# Patient Record
Sex: Female | Born: 1950
Health system: Southern US, Community
[De-identification: ages and names within clinical notes are randomized; demographics above are authoritative.]

## PROBLEM LIST (undated history)

## (undated) DIAGNOSIS — T4145XA Adverse effect of unspecified anesthetic, initial encounter: Secondary | ICD-10-CM

## (undated) DIAGNOSIS — T8859XA Other complications of anesthesia, initial encounter: Secondary | ICD-10-CM

## (undated) DIAGNOSIS — M5136 Other intervertebral disc degeneration, lumbar region: Secondary | ICD-10-CM

## (undated) DIAGNOSIS — E079 Disorder of thyroid, unspecified: Secondary | ICD-10-CM

## (undated) DIAGNOSIS — R42 Dizziness and giddiness: Secondary | ICD-10-CM

## (undated) DIAGNOSIS — N289 Disorder of kidney and ureter, unspecified: Secondary | ICD-10-CM

## (undated) DIAGNOSIS — I2510 Atherosclerotic heart disease of native coronary artery without angina pectoris: Secondary | ICD-10-CM

## (undated) DIAGNOSIS — E039 Hypothyroidism, unspecified: Secondary | ICD-10-CM

## (undated) DIAGNOSIS — Z9889 Other specified postprocedural states: Secondary | ICD-10-CM

## (undated) DIAGNOSIS — G4730 Sleep apnea, unspecified: Secondary | ICD-10-CM

## (undated) DIAGNOSIS — R112 Nausea with vomiting, unspecified: Secondary | ICD-10-CM

## (undated) DIAGNOSIS — J320 Chronic maxillary sinusitis: Secondary | ICD-10-CM

## (undated) DIAGNOSIS — M810 Age-related osteoporosis without current pathological fracture: Secondary | ICD-10-CM

## (undated) DIAGNOSIS — K862 Cyst of pancreas: Secondary | ICD-10-CM

## (undated) DIAGNOSIS — I700 Atherosclerosis of aorta: Secondary | ICD-10-CM

## (undated) HISTORY — PX: CARPAL TUNNEL RELEASE: SHX101

## (undated) HISTORY — DX: Sleep apnea, unspecified: G47.30

## (undated) HISTORY — PX: BACK SURGERY: SHX140

---

## 1970-01-29 HISTORY — PX: CYSTECTOMY: SUR359

## 1978-01-29 HISTORY — PX: TUBAL LIGATION: SHX77

## 1997-11-15 ENCOUNTER — Ambulatory Visit (HOSPITAL_COMMUNITY): Admission: RE | Admit: 1997-11-15 | Discharge: 1997-11-15 | Payer: Self-pay | Admitting: Neurosurgery

## 1998-01-24 ENCOUNTER — Encounter: Payer: Self-pay | Admitting: Neurosurgery

## 1998-01-24 ENCOUNTER — Inpatient Hospital Stay (HOSPITAL_COMMUNITY): Admission: RE | Admit: 1998-01-24 | Discharge: 1998-01-25 | Payer: Self-pay | Admitting: Neurosurgery

## 1998-11-11 ENCOUNTER — Encounter: Payer: Self-pay | Admitting: Neurosurgery

## 1998-11-11 ENCOUNTER — Ambulatory Visit (HOSPITAL_COMMUNITY): Admission: RE | Admit: 1998-11-11 | Discharge: 1998-11-11 | Payer: Self-pay | Admitting: Neurosurgery

## 1998-11-25 ENCOUNTER — Ambulatory Visit (HOSPITAL_COMMUNITY): Admission: RE | Admit: 1998-11-25 | Discharge: 1998-11-25 | Payer: Self-pay | Admitting: Neurosurgery

## 1998-11-25 ENCOUNTER — Encounter: Payer: Self-pay | Admitting: Neurosurgery

## 1998-12-01 ENCOUNTER — Encounter: Admission: RE | Admit: 1998-12-01 | Discharge: 1998-12-01 | Payer: Self-pay | Admitting: Neurosurgery

## 1998-12-01 ENCOUNTER — Encounter: Payer: Self-pay | Admitting: Neurosurgery

## 1999-01-04 ENCOUNTER — Encounter: Payer: Self-pay | Admitting: Neurosurgery

## 1999-01-06 ENCOUNTER — Encounter: Payer: Self-pay | Admitting: Neurosurgery

## 1999-01-06 ENCOUNTER — Ambulatory Visit (HOSPITAL_COMMUNITY): Admission: RE | Admit: 1999-01-06 | Discharge: 1999-01-07 | Payer: Self-pay | Admitting: Neurosurgery

## 1999-12-28 ENCOUNTER — Other Ambulatory Visit: Admission: RE | Admit: 1999-12-28 | Discharge: 1999-12-28 | Payer: Self-pay | Admitting: Obstetrics and Gynecology

## 2000-01-05 ENCOUNTER — Encounter: Payer: Self-pay | Admitting: Obstetrics and Gynecology

## 2000-01-05 ENCOUNTER — Encounter: Admission: RE | Admit: 2000-01-05 | Discharge: 2000-01-05 | Payer: Self-pay | Admitting: Obstetrics and Gynecology

## 2000-01-12 ENCOUNTER — Other Ambulatory Visit: Admission: RE | Admit: 2000-01-12 | Discharge: 2000-01-12 | Payer: Self-pay | Admitting: Obstetrics and Gynecology

## 2000-01-12 ENCOUNTER — Encounter (INDEPENDENT_AMBULATORY_CARE_PROVIDER_SITE_OTHER): Payer: Self-pay | Admitting: Specialist

## 2000-06-20 ENCOUNTER — Ambulatory Visit (HOSPITAL_COMMUNITY): Admission: RE | Admit: 2000-06-20 | Discharge: 2000-06-20 | Payer: Self-pay | Admitting: Internal Medicine

## 2000-06-20 ENCOUNTER — Encounter: Payer: Self-pay | Admitting: Internal Medicine

## 2000-12-31 ENCOUNTER — Other Ambulatory Visit: Admission: RE | Admit: 2000-12-31 | Discharge: 2000-12-31 | Payer: Self-pay | Admitting: Obstetrics and Gynecology

## 2001-01-06 ENCOUNTER — Encounter: Payer: Self-pay | Admitting: Obstetrics and Gynecology

## 2001-01-06 ENCOUNTER — Encounter: Admission: RE | Admit: 2001-01-06 | Discharge: 2001-01-06 | Payer: Self-pay | Admitting: Obstetrics and Gynecology

## 2001-01-10 ENCOUNTER — Encounter: Admission: RE | Admit: 2001-01-10 | Discharge: 2001-01-10 | Payer: Self-pay | Admitting: Gastroenterology

## 2001-01-10 ENCOUNTER — Encounter: Payer: Self-pay | Admitting: Gastroenterology

## 2001-01-13 ENCOUNTER — Encounter: Admission: RE | Admit: 2001-01-13 | Discharge: 2001-01-13 | Payer: Self-pay | Admitting: Obstetrics and Gynecology

## 2001-01-13 ENCOUNTER — Encounter: Payer: Self-pay | Admitting: Obstetrics and Gynecology

## 2001-04-23 ENCOUNTER — Ambulatory Visit (HOSPITAL_COMMUNITY): Admission: RE | Admit: 2001-04-23 | Discharge: 2001-04-23 | Payer: Self-pay | Admitting: Gastroenterology

## 2002-01-06 ENCOUNTER — Other Ambulatory Visit: Admission: RE | Admit: 2002-01-06 | Discharge: 2002-01-06 | Payer: Self-pay | Admitting: Obstetrics and Gynecology

## 2002-02-13 ENCOUNTER — Encounter: Admission: RE | Admit: 2002-02-13 | Discharge: 2002-02-13 | Payer: Self-pay | Admitting: Obstetrics and Gynecology

## 2002-02-13 ENCOUNTER — Encounter: Payer: Self-pay | Admitting: Obstetrics and Gynecology

## 2002-04-23 ENCOUNTER — Encounter: Payer: Self-pay | Admitting: Family Medicine

## 2002-04-23 ENCOUNTER — Ambulatory Visit (HOSPITAL_COMMUNITY): Admission: RE | Admit: 2002-04-23 | Discharge: 2002-04-23 | Payer: Self-pay | Admitting: Family Medicine

## 2002-10-23 ENCOUNTER — Ambulatory Visit (HOSPITAL_COMMUNITY): Admission: RE | Admit: 2002-10-23 | Discharge: 2002-10-23 | Payer: Self-pay | Admitting: Internal Medicine

## 2002-10-23 ENCOUNTER — Encounter: Payer: Self-pay | Admitting: Internal Medicine

## 2002-11-27 ENCOUNTER — Ambulatory Visit (HOSPITAL_COMMUNITY): Admission: RE | Admit: 2002-11-27 | Discharge: 2002-11-27 | Payer: Self-pay | Admitting: Internal Medicine

## 2002-12-31 ENCOUNTER — Ambulatory Visit (HOSPITAL_COMMUNITY): Admission: RE | Admit: 2002-12-31 | Discharge: 2002-12-31 | Payer: Self-pay | Admitting: Internal Medicine

## 2003-01-08 ENCOUNTER — Other Ambulatory Visit: Admission: RE | Admit: 2003-01-08 | Discharge: 2003-01-08 | Payer: Self-pay | Admitting: Obstetrics and Gynecology

## 2003-02-18 ENCOUNTER — Encounter: Admission: RE | Admit: 2003-02-18 | Discharge: 2003-02-18 | Payer: Self-pay | Admitting: Obstetrics and Gynecology

## 2003-03-09 ENCOUNTER — Encounter: Admission: RE | Admit: 2003-03-09 | Discharge: 2003-03-09 | Payer: Self-pay | Admitting: Obstetrics and Gynecology

## 2003-05-05 ENCOUNTER — Ambulatory Visit (HOSPITAL_COMMUNITY): Admission: RE | Admit: 2003-05-05 | Discharge: 2003-05-05 | Payer: Self-pay | Admitting: Internal Medicine

## 2003-05-14 ENCOUNTER — Ambulatory Visit (HOSPITAL_COMMUNITY): Admission: RE | Admit: 2003-05-14 | Discharge: 2003-05-14 | Payer: Self-pay | Admitting: Family Medicine

## 2004-03-21 ENCOUNTER — Encounter: Admission: RE | Admit: 2004-03-21 | Discharge: 2004-03-21 | Payer: Self-pay | Admitting: Obstetrics and Gynecology

## 2004-06-12 ENCOUNTER — Emergency Department (HOSPITAL_COMMUNITY): Admission: EM | Admit: 2004-06-12 | Discharge: 2004-06-12 | Payer: Self-pay | Admitting: Emergency Medicine

## 2004-10-17 ENCOUNTER — Ambulatory Visit: Payer: Self-pay | Admitting: Internal Medicine

## 2004-11-01 ENCOUNTER — Ambulatory Visit: Payer: Self-pay | Admitting: Internal Medicine

## 2004-11-01 ENCOUNTER — Ambulatory Visit (HOSPITAL_COMMUNITY): Admission: RE | Admit: 2004-11-01 | Discharge: 2004-11-01 | Payer: Self-pay | Admitting: Internal Medicine

## 2004-11-10 ENCOUNTER — Other Ambulatory Visit: Admission: RE | Admit: 2004-11-10 | Discharge: 2004-11-10 | Payer: Self-pay | Admitting: Obstetrics and Gynecology

## 2005-02-01 ENCOUNTER — Ambulatory Visit: Payer: Self-pay | Admitting: Internal Medicine

## 2005-02-05 ENCOUNTER — Ambulatory Visit: Payer: Self-pay | Admitting: Internal Medicine

## 2005-02-05 ENCOUNTER — Ambulatory Visit (HOSPITAL_COMMUNITY): Admission: RE | Admit: 2005-02-05 | Discharge: 2005-02-05 | Payer: Self-pay | Admitting: Internal Medicine

## 2005-03-22 ENCOUNTER — Encounter: Admission: RE | Admit: 2005-03-22 | Discharge: 2005-03-22 | Payer: Self-pay | Admitting: Obstetrics and Gynecology

## 2005-05-22 ENCOUNTER — Ambulatory Visit (HOSPITAL_COMMUNITY): Admission: RE | Admit: 2005-05-22 | Discharge: 2005-05-22 | Payer: Self-pay | Admitting: Internal Medicine

## 2005-11-14 ENCOUNTER — Other Ambulatory Visit: Admission: RE | Admit: 2005-11-14 | Discharge: 2005-11-14 | Payer: Self-pay | Admitting: Obstetrics and Gynecology

## 2006-04-01 ENCOUNTER — Encounter: Admission: RE | Admit: 2006-04-01 | Discharge: 2006-04-01 | Payer: Self-pay | Admitting: Obstetrics and Gynecology

## 2007-01-07 ENCOUNTER — Ambulatory Visit (HOSPITAL_COMMUNITY): Admission: RE | Admit: 2007-01-07 | Discharge: 2007-01-07 | Payer: Self-pay | Admitting: Internal Medicine

## 2007-04-02 ENCOUNTER — Encounter: Admission: RE | Admit: 2007-04-02 | Discharge: 2007-04-02 | Payer: Self-pay | Admitting: Obstetrics and Gynecology

## 2007-10-03 ENCOUNTER — Ambulatory Visit (HOSPITAL_COMMUNITY): Admission: RE | Admit: 2007-10-03 | Discharge: 2007-10-03 | Payer: Self-pay | Admitting: Internal Medicine

## 2008-04-02 ENCOUNTER — Encounter: Admission: RE | Admit: 2008-04-02 | Discharge: 2008-04-02 | Payer: Self-pay | Admitting: Obstetrics and Gynecology

## 2008-07-26 ENCOUNTER — Emergency Department (HOSPITAL_COMMUNITY): Admission: EM | Admit: 2008-07-26 | Discharge: 2008-07-26 | Payer: Self-pay | Admitting: Emergency Medicine

## 2009-04-07 ENCOUNTER — Encounter: Admission: RE | Admit: 2009-04-07 | Discharge: 2009-04-07 | Payer: Self-pay | Admitting: Obstetrics and Gynecology

## 2009-07-22 ENCOUNTER — Emergency Department (HOSPITAL_COMMUNITY): Admission: EM | Admit: 2009-07-22 | Discharge: 2009-07-22 | Payer: Self-pay | Admitting: Emergency Medicine

## 2009-10-26 ENCOUNTER — Ambulatory Visit (HOSPITAL_COMMUNITY): Admission: RE | Admit: 2009-10-26 | Discharge: 2009-10-26 | Payer: Self-pay | Admitting: Ophthalmology

## 2009-11-23 ENCOUNTER — Encounter: Admission: RE | Admit: 2009-11-23 | Discharge: 2009-11-23 | Payer: Self-pay | Admitting: Obstetrics and Gynecology

## 2010-03-27 ENCOUNTER — Other Ambulatory Visit: Payer: Self-pay | Admitting: Obstetrics and Gynecology

## 2010-03-27 DIAGNOSIS — Z1231 Encounter for screening mammogram for malignant neoplasm of breast: Secondary | ICD-10-CM

## 2010-04-05 ENCOUNTER — Ambulatory Visit: Payer: Self-pay

## 2010-04-13 ENCOUNTER — Ambulatory Visit: Payer: Self-pay

## 2010-05-08 LAB — URINALYSIS, ROUTINE W REFLEX MICROSCOPIC
Glucose, UA: NEGATIVE mg/dL
pH: 5.5 (ref 5.0–8.0)

## 2010-05-08 LAB — CBC
HCT: 40.9 % (ref 36.0–46.0)
MCV: 92.9 fL (ref 78.0–100.0)
Platelets: 296 10*3/uL (ref 150–400)
RBC: 4.4 MIL/uL (ref 3.87–5.11)
WBC: 11.3 10*3/uL — ABNORMAL HIGH (ref 4.0–10.5)

## 2010-05-08 LAB — DIFFERENTIAL
Eosinophils Absolute: 0.1 10*3/uL (ref 0.0–0.7)
Eosinophils Relative: 1 % (ref 0–5)
Lymphocytes Relative: 45 % (ref 12–46)
Lymphs Abs: 5.1 10*3/uL — ABNORMAL HIGH (ref 0.7–4.0)
Monocytes Relative: 7 % (ref 3–12)
Neutrophils Relative %: 47 % (ref 43–77)

## 2010-05-08 LAB — URINE MICROSCOPIC-ADD ON

## 2010-05-08 LAB — BASIC METABOLIC PANEL
BUN: 29 mg/dL — ABNORMAL HIGH (ref 6–23)
Chloride: 105 mEq/L (ref 96–112)
GFR calc Af Amer: >60 mL/min (ref >60)
GFR calc non Af Amer: >60 mL/min (ref >60)
Potassium: 3 mEq/L — ABNORMAL LOW (ref 3.5–5.1)

## 2010-06-15 ENCOUNTER — Ambulatory Visit
Admission: RE | Admit: 2010-06-15 | Discharge: 2010-06-15 | Disposition: A | Discharge status: Home / Self Care | Payer: 59 | Source: Ambulatory Visit | Attending: Obstetrics and Gynecology | Admitting: Obstetrics and Gynecology

## 2010-06-15 DIAGNOSIS — Z1231 Encounter for screening mammogram for malignant neoplasm of breast: Secondary | ICD-10-CM

## 2010-06-16 NOTE — H&P (Signed)
NAME:  Kristin [name], Kristin [name]                 ACCOUNT NO.:  [id number]   MEDICAL RECORD NO.:  [id number]          PATIENT TYPE:  AMB   LOCATION:  DAY                           FACILITY:  [facility]   PHYSICIAN:  Lionel December, M.D.    DATE OF BIRTH:  Jul 23, 1950   DATE OF ADMISSION:  DATE OF DISCHARGE:  LH                                HISTORY & PHYSICAL   PRESENTING COMPLAINT:  1.  Dysphagia.  2.  Odynophagia.  3.  Rectal bleeding.  4.  History of colonic polyps.   HISTORY OF PRESENT ILLNESS:  Kristin [name] is a 60 year old Caucasian female patient  of Dr. Sherwood Gambler who is here for scheduled visit.  She was last seen in January  2005.   She initially presented with dysphagia back in October 2004.  She had prior  barium study revealing cervical esophageal prominent cricopharyngeal  impression and frequent tertiary contractions of the esophagus.  She  underwent esophagogastroduodenoscopy with esophageal dilation.  On November 27, 2002, she had normal exam of the esophagus. Esophageal dilation was  accomplished for passing 56-French Maloney dilator causing small tear in the  cervical esophagus suggesting a web.  She also had antral gastritis.  Her  H.pylori serology was negative.  She noted some, but incomplete resolution  of her dysphagia.  She, therefore, had repeat barium study in December 2004,  which revealed mildly impaired esophageal motility, but no evidence of  stricture or ring, and she is able to swallow 13 mm barium pill without any  difficulty.  She says her dysphagia has gotten worse.  It is intermittent  and she also has pain in her lower sternal area that is occurring at a  frequency of once a week.  This occurs but once a week.  She has most  difficulty with solids, but at times has difficulty drinking water.  She  states her heartburn is well controlled with double dose therapy.  She  denies hoarseness, chronic cough or need to clear her throat.  She also  complains of intermittent  hematochezia.  She passed a small to moderate  amount of fresh blood through the bowel movements.  She denies diarrhea,  constipation or lower abdominal pain.  She had colonoscopy by Dr. Charna Elizabeth 6 years ago.  She had one large polyp removed, but two others were  small, and possibly hyperplastic and less benign.  She was advised a follow  up exam in five years, but because of her father's illness, she has not had  a follow up exam, but she would like to get it done locally.  She lost her  father in June this year secondary to complications of chemotherapy given to  him following surgery for colon carcinoma.  He was 33-46 years old.  Kristin [name]  has good appetite.  She denies anorexia or weight loss.   MEDICATIONS:  1.  Aciphex 20 mg b.i.d.  2.  __________ eye drops daily.  3.  Rolaids p.r.n.  4.  Tylenol 1-2 t.i.d. p.r.n.   PAST MEDICAL HISTORY:  1.  History of back pain.  She has had surgery for bulging disk and she had      a fall and injured her back again, leading to second surgery which was      about 6 years ago.  2.  History of colonic polyp and GERD as above.  3.  Rubber banding for external hemorrhoids by Dr. Earlene Plater.   ALLERGIES:  PENICILLIN.   FAMILY HISTORY:  Father was diagnosed with colon carcinoma at age 66 and  died secondary to complications of adjuvant chemotherapy.   SOCIAL HISTORY:  She is married.  She had one daughter who died at age 56  secondary to ruptured intracranial aneurysm.  She used to work at [employer] until it relocated, and for the past 10 years she has been  working with [employer] in [place].  She smoked cigarettes off and on a  few years but quit about 18 years ago.   PHYSICAL EXAMINATION:  GENERAL APPEARANCE:  Pleasant, well-developed, well-  nourished, Caucasian female who is in no acute distress.  VITAL SIGNS:  She weighs 135 pounds, 5 feet 2 inches tall, pulse 68, blood  pressure 108/62, temperature 98.  HEENT:   Conjunctivae is pink.  Sclerae is nonicteric.  Oropharyngeal and  mucosa is normal.  NECK:  No masses or thyromegaly.  CARDIAC:  Regular rhythm.  Normal S1, S3.  No murmur or gallop noted.  LUNGS:  Clear to auscultation.  ABDOMEN:  Symmetrical, soft and tenderness with no organomegaly or masses.  RECTAL:  Deferred.  No clubbing or edema noted.   ASSESSMENT:  1.  Dysphagia/odynophagia.  She has chronic gastroesophageal reflux disease      and her heartburn appears to be well controlled with double dose of PPI      and occasional use of over-the-counter antacids.  Barium pill study in      December 2004 suggested esophageal dysmotility.  I suspect we may be      dealing with diffuse esophageal spasm or nutcracker esophagus.  If she      does not respond to symptomatic therapy, she will need esophageal      monometry.  2.  Hematochezia, possibly secondary to hemorrhoids.  She has history of      polyps and is overdue for several years, colonoscopy, since her last      exam which was 6 years ago, she also lost her father due to      complications of chemotherapy from colon carcinoma.   RECOMMENDATIONS:  1.  She will continue antireflux measures and Aciphex as before.  2.  Trial with NuLev sublingual one before each meal, samples given.  3.  Will schedule for diagnostic/surveillance colonoscopy over the next two      weeks.  If her esophageal symptoms persist, may consider EGD and      possible ED prior to manometry or impedence study.      Lionel December, M.D.  Electronically Signed     NR/MEDQ  D:  10/17/2004  T:  10/18/2004  Job:  [id number]   cc:   Madelin Rear. Sherwood Gambler, MD  Fax: [phone]

## 2010-06-16 NOTE — Op Note (Signed)
NAMEAZUCENA, DART                 ACCOUNT NO.:  [id number]   MEDICAL RECORD NO.:  [id number]          PATIENT TYPE:  AMB   LOCATION:  DAY                           FACILITY:  [facility]   PHYSICIAN:  Lionel December, M.D.    DATE OF BIRTH:  [birth year]   DATE OF PROCEDURE:  02/05/2005  DATE OF DISCHARGE:                                 OPERATIVE REPORT   PROCEDURE:  Esophageal manometry.   INDICATION:  Kristin [name] is a 60 year old Caucasian female who continues to  experience dysphagia intermittently both to solids as well as liquids. She  had her esophagus dilated on two occasions with transient relief of her  symptoms. She is suspected to have motility disorder based on barium study,  but she had no difficulty swallowing 13-mm barium pill. She is undergoing  manometry to further evaluate her dysphagia. Procedures were reviewed with  the patient, and informed consent was obtained.   PROCEDURE:  Standard   FINDINGS:  Esophageal body. Eight out of 10 swallows were peristaltic and  two were simultaneous. Mean amplitude at 13 cm was 52.7 mm, at 8 cm was 77.2  and at 3 cm was 151.3 and mean and distal two ports was 114.2. Mean duration  3.0 seconds at 13 cm, 3.7 seconds at 8 cm, 4.9 seconds at 3 cm and mean and  distal two ports was 4.3.   The lower esophageal sphincter was located between 43 to 45 cm from the  nares.   Mean resting LES pressure was 20.1. A relaxation was 86.7%.   IMPRESSION:  1.  Normal LES resting pressure with adequate relaxation (greater than 75%).  2.  Nonspecific esophageal motility disorder with 80% of swallows being      peristaltic and 20% simultaneous.   RECOMMENDATIONS:  She will continue anti-reflux measures and PPI. The  patient advised to spend few extra minutes when she eats meals. She will  keep a written diary as to the frequency of these episodes. This will be  reviewed with the patient in a month and will determine whether she should  be referred for  impedence study.      Lionel December, M.D.  Electronically Signed     NR/MEDQ  D:  02/06/2005  T:  02/06/2005  Job:  [id number]
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
## 2010-06-16 NOTE — Op Note (Signed)
NAME:  Kristin, [name]                           ACCOUNT NO.:  [id number]   MEDICAL RECORD NO.:  [id number]                   PATIENT TYPE:  AMB   LOCATION:  DAY                                  FACILITY:  [facility]   PHYSICIAN:  Lionel December, M.D.                 DATE OF BIRTH:  12-May-1950   DATE OF PROCEDURE:  11/27/2002  DATE OF DISCHARGE:                                 OPERATIVE REPORT   PROCEDURE:  Esophagogastroduodenoscopy.   INDICATIONS FOR PROCEDURE:  Kristin [name] is a 60 year old Caucasian female who has  been having intermittent dysphasia primarily to solids for the last several  months.  She also has intermittent heartburn.  She also has experienced  occasional odynophagia.  Recent barium study revealed a prominent  cricopharyngeus and tertiary contractions but no stricture or ring.  She is  undergoing diagnostic and therapeutic EGD.  She was given Protonix samples  yesterday which she is to start today.   The procedure risks were reviewed with the patient, and informed consent for  the procedure was obtained.   PREOPERATIVE MEDICATIONS:  Cetacaine spray for pharyngeal topical  anesthesia, Demerol 25 mg IV, Versed 6 mg IV in divided doses.   FINDINGS:  The procedure was performed in the endoscopy suite.  The  patient's vital signs and O2 saturations were monitored during the procedure  and remained stable.  The patient was placed in the left lateral recumbent  position, and the Olympus videoscope was passed via the oropharynx without  any difficulty into the esophagus.   Esophagus:  The mucosa of the esophagus was normal throughout.  The  squamocolumnar junction was unremarkable.  No ring or stricture was noted.   Stomach:  It was empty and distended very well with insufflation.  The folds  of the proximal stomach were normal.  Examination of  the mucosa revealed  multiple petechiae of the gastric body and nodular antral mucosa with  erythema.  The pyloric channel was  patent.  The angularis and fundus were  examined by retroflexing the scope and were normal.  The cardia was also  normal.   Duodenum:  Examination of the bulb and second part of the duodenum was  normal.  The endoscope was withdrawn.   The esophagus was dilated by passing a 56 [place] Maloney dilator through the  esophagus completely.  As the dilator was withdrawn, the endoscope was  passed again.  There was a very small tear in the cervical esophagus,  possibly suggesting a web.  No mucosal injury was noted to the GE junction  or the rest of the esophagus.  The endoscope was withdrawn.  The patient  tolerated the procedure well.   FINAL DIAGNOSES:  1. Antral gastritis.  2. Normal examination of the esophagus.  However, dilation resulted in a     small tear in the cervical esophagus, suggesting an esophageal web.  RECOMMENDATIONS:  1. She will continue antireflux measures and will start Protonix 40 mg p.o.     q.a.m.  2. H pylori serology will be checked today.  3. She will return for followup within four weeks.  If she remains with     dysphagia, we will consider esophageal manometry.      ___________________________________________                                            Lionel December, M.D.   NR/MEDQ  D:  11/27/2002  T:  11/27/2002  Job:  [id number]   cc:   Madelin Rear. Sherwood Gambler, M.D.  [address]  [place]  [place] [address]  Fax: [phone]

## 2010-06-16 NOTE — Op Note (Signed)
Kristin [name], Kristin [name]                 ACCOUNT NO.:  [id number]   MEDICAL RECORD NO.:  [id number]          PATIENT TYPE:  AMB   LOCATION:  DAY                           FACILITY:  [facility]   PHYSICIAN:  Lionel December, M.D.    DATE OF BIRTH:  08/14/1950   DATE OF PROCEDURE:  11/01/2004  DATE OF DISCHARGE:                                 OPERATIVE REPORT   PROCEDURE:  Esophagogastroduodenoscopy with esophageal dilation followed by  colonoscopy.   INDICATION:  Lametria is a 60 year old Caucasian female with chronic GERD who  presents with dysphagia and odynophagia. Her last EGD was 2 years ago when  she was found to have a small esophageal valve disrupted by passing 56-  [place] Maloney dilator. She also gives history of intermittent hematochezia  and she had two polyps removed 6 years ago in [place] and is overdue for  surveillance colonoscopy. She would like to get both the studies done at the  same time.   Procedure was reviewed the patient and informed consent was obtained.   PREMEDICATION:  Cetacaine spray pharyngeal topical anesthesia, Demerol 50 mg  IV, Versed 8 mg IV.   FINDINGS:  Procedure performed in endoscopy suite. The patient's vital signs  and O2 sat were monitored during procedure and remained stable.   PROCEDURE:  1.  Esophagogastroduodenoscopy. The patient was placed in left lateral      recumbent position. Olympus videoscope was passed through oropharynx      without any difficulty into esophagus.   Mucosa of the esophagus was normal throughout. No ring stricture was  apparent. GE junction was at 36 cm from the incisors and was unremarkable.   Stomach. It was empty and distended very well with insufflation. Folds of  proximal stomach were normal. Examination of mucosa was normal. There was a  7-8 mm submucosal lesion at antrum suspicious for a small lipoma. This was  left alone. Pyloric channel was patent. Angularis, fundus and cardia were  examined by retroflexion of  the scope and were normal.   Duodenum. Bulbar mucosa was normal. Scope was passed to second part of  duodenal mucosa and folds were normal. Endoscope was withdrawn.   Esophagus was dilated by passing 56-French Maloney dilator to full  insertion. As the dilator was withdrawn, endoscope was passed again and  there was a small bowl-shaped tear noted to cervical esophagus possibly  indicative of web. Pictures taken for the record. Endoscope was withdrawn  and the patient prepared for procedure #2.   Colonoscopy. Rectal examination performed. No abnormality noted in external  or digital exam. Olympus videoscope was placed in the rectum and advanced  under vision into sigmoid colon and beyond. Preparation was satisfactory.  Scope was passed to the cecum which was identified by appendiceal orifice  and ileocecal valve. Pictures taken for the record. As the scope was  withdrawn, colonic mucosa was examined for the second time and there were no  polyps or other abnormalities. Rectal mucosa was normal. Scope was  retroflexed to examine anorectal junction. There was single anal papilla and  small hemorrhoids below  the dentate line. Endoscope was straightened and  withdrawn. The patient tolerated the procedure well.   FINAL DIAGNOSIS:  1.  Small submucosal antral lesion consistent with submucosal lipoma. Normal      examination of the esophagus. However, esophageal dilation resulted in      small tear of cervical esophagus indicative of web.  2.  Normal colonoscopy except single anal papilla and small external      hemorrhoids. Hemorrhoids felt to be source of intermittent hematochezia.      No evidence of recurrent polyps.   RECOMMENDATIONS:  She will continue anti-reflux measures and PPI as before.  She will call office with progress report in 1 week. If she remains with  dysphagia, will bring her back for esophageal manometry.   She will undergo surveillance colonoscopy in 5 years from  now.      Lionel December, M.D.  Electronically Signed     NR/MEDQ  D:  11/01/2004  T:  11/01/2004  Job:  [id number]   cc:   Madelin Rear. Sherwood Gambler, MD  Fax: [phone]

## 2010-06-16 NOTE — Op Note (Signed)
[place]  Patient:    Kristin [name], FOULK Visit Number: [id number] MRN: [id number]          Service Type: END Location: ENDO Attending Physician:  Charna Elizabeth Dictated by:   Anselmo Rod, M.D. Proc. Date: 04/23/01 Admit Date:  04/23/2001   CC:         Silverio Lay, M.D.   Operative Report  DATE OF BIRTH:  1951/01/14  REFERRING PHYSICIAN:  Silverio Lay, M.D.  PROCEDURE PERFORMED:  Colonoscopy.  ENDOSCOPIST:  Anselmo Rod, M.D.  INSTRUMENT USED:  Olympus video colonoscope.  INDICATIONS FOR PROCEDURE:  The patient is a 60 year old white female with a history of rectal bleeding.  Rule out colonic polyps, masses, hemorrhoids, etc.  PREPROCEDURE PREPARATION:  Informed consent was procured from the patient. The patient was fasted for eight hours prior to the procedure and prepped with a bottle of magnesium citrate and a gallon of NuLytely the night prior to the procedure.  PREPROCEDURE PHYSICAL:  The patient had stable vital signs.  Neck supple. Chest clear to auscultation.  S1, S2 regular, no murmurs, gallops or rubs, rales, rhonchi or wheezing.  Abdomen soft with normal bowel sounds.  DESCRIPTION OF PROCEDURE:  The patient was placed in the left lateral decubitus position and sedated with 70 mg of Demerol and 7 mg of Versed intravenously.  Once the patient was adequately sedated and maintained on low-flow oxygen and continuous cardiac monitoring, the Olympus video colonoscope was advanced from the rectum to the cecum without difficulty. Except for a prominent internal hemorrhoid, no other abnormalities were seen up to the cecum.  The appendicular orifice and the ileocecal valve were clearly visualized and photographed.  There were no other masses, polyps, erosions, ulcerations or diverticula present.  IMPRESSION:  Prominent internal hemorrhoid.  Otherwise normal-appearing colon up to the cecum.  RECOMMENDATIONS: 1.  Surgical evaluation for possible hemorrhoidectomy. 2. Outpatient follow-up in the next three to four weeks. 3. Repeat colorectal screening in the next five years unless the patient were    to develop any abnormal symptoms in the interim.Dictated by:   Anselmo Rod, M.D. Attending Physician:  Charna Elizabeth DD:  04/23/01 TD:  04/24/01 Job: [id number]

## 2010-11-07 ENCOUNTER — Other Ambulatory Visit (HOSPITAL_COMMUNITY): Payer: Self-pay | Admitting: Internal Medicine

## 2010-11-07 DIAGNOSIS — M47812 Spondylosis without myelopathy or radiculopathy, cervical region: Secondary | ICD-10-CM

## 2010-11-08 ENCOUNTER — Ambulatory Visit (HOSPITAL_COMMUNITY)
Admission: RE | Admit: 2010-11-08 | Discharge: 2010-11-08 | Disposition: A | Discharge status: Home / Self Care | Payer: 59 | Source: Ambulatory Visit | Attending: Internal Medicine | Admitting: Internal Medicine

## 2010-11-08 DIAGNOSIS — M47812 Spondylosis without myelopathy or radiculopathy, cervical region: Secondary | ICD-10-CM

## 2010-11-08 DIAGNOSIS — M5020 Other cervical disc displacement, unspecified cervical region: Secondary | ICD-10-CM | POA: Insufficient documentation

## 2010-11-08 DIAGNOSIS — M542 Cervicalgia: Secondary | ICD-10-CM | POA: Insufficient documentation

## 2010-11-08 DIAGNOSIS — M25519 Pain in unspecified shoulder: Secondary | ICD-10-CM | POA: Insufficient documentation

## 2010-11-09 ENCOUNTER — Inpatient Hospital Stay (HOSPITAL_COMMUNITY): Admission: RE | Admit: 2010-11-09 | Payer: 59 | Source: Ambulatory Visit

## 2011-06-06 ENCOUNTER — Other Ambulatory Visit: Payer: Self-pay | Admitting: Obstetrics and Gynecology

## 2011-06-06 DIAGNOSIS — Z1231 Encounter for screening mammogram for malignant neoplasm of breast: Secondary | ICD-10-CM

## 2011-06-19 ENCOUNTER — Ambulatory Visit: Payer: 59

## 2011-06-19 ENCOUNTER — Ambulatory Visit
Admission: RE | Admit: 2011-06-19 | Discharge: 2011-06-19 | Disposition: A | Discharge status: Home / Self Care | Payer: 59 | Source: Ambulatory Visit | Attending: Obstetrics and Gynecology | Admitting: Obstetrics and Gynecology

## 2011-06-19 DIAGNOSIS — Z1231 Encounter for screening mammogram for malignant neoplasm of breast: Secondary | ICD-10-CM

## 2011-11-16 ENCOUNTER — Other Ambulatory Visit: Payer: Self-pay | Admitting: Internal Medicine

## 2011-11-16 ENCOUNTER — Other Ambulatory Visit (HOSPITAL_COMMUNITY): Payer: Self-pay | Admitting: Family Medicine

## 2011-11-16 ENCOUNTER — Ambulatory Visit (HOSPITAL_COMMUNITY)
Admission: RE | Admit: 2011-11-16 | Discharge: 2011-11-16 | Disposition: A | Discharge status: Home / Self Care | Payer: 59 | Source: Ambulatory Visit | Attending: Family Medicine | Admitting: Family Medicine

## 2011-11-16 DIAGNOSIS — R079 Chest pain, unspecified: Secondary | ICD-10-CM

## 2011-11-16 DIAGNOSIS — R918 Other nonspecific abnormal finding of lung field: Secondary | ICD-10-CM | POA: Insufficient documentation

## 2011-11-19 ENCOUNTER — Ambulatory Visit (HOSPITAL_COMMUNITY)
Admission: RE | Admit: 2011-11-19 | Discharge: 2011-11-19 | Disposition: A | Discharge status: Home / Self Care | Payer: 59 | Source: Ambulatory Visit | Attending: Family Medicine | Admitting: Family Medicine

## 2011-11-19 DIAGNOSIS — R911 Solitary pulmonary nodule: Secondary | ICD-10-CM | POA: Insufficient documentation

## 2011-11-19 DIAGNOSIS — R079 Chest pain, unspecified: Secondary | ICD-10-CM

## 2011-11-19 MED ORDER — IOHEXOL 300 MG/ML  SOLN
80.0000 mL | Freq: Once | INTRAMUSCULAR | Status: AC | PRN
  Administered 2011-11-19: 80 mL via INTRAVENOUS

## 2011-12-06 ENCOUNTER — Ambulatory Visit (INDEPENDENT_AMBULATORY_CARE_PROVIDER_SITE_OTHER): Payer: 59 | Admitting: Emergency Medicine

## 2011-12-06 ENCOUNTER — Encounter: Payer: Self-pay | Admitting: Emergency Medicine

## 2011-12-06 VITALS — BP 124/70 | HR 66 | Temp 98.2°F | Ht 61.5 in | Wt 129.2 lb

## 2011-12-06 DIAGNOSIS — R911 Solitary pulmonary nodule: Secondary | ICD-10-CM | POA: Insufficient documentation

## 2011-12-06 NOTE — Patient Instructions (Addendum)
We will repeat your CT scan of the chest in April 2014 Please follow with Dr Delton Coombes in April after the CT scan to review the results If you develop any new symptoms such as cough, shortness of breath, chest pain then please contact our office to be evaluated sooner.

## 2011-12-06 NOTE — Assessment & Plan Note (Signed)
RUL ground glass nodule. No solid component (yet). This is suspicious for BAC. If it gets larger or if it changes consistency on CT scan then I believe she needs PET scan and then TCTS referral to have it removed.

## 2011-12-06 NOTE — Progress Notes (Signed)
Subjective:     Patient ID: Kristin [name], female   DOB: 28-Oct-1950, 61 y.o.   MRN: [id number]  HPI 61 yo remote very mild smoker, hx OSA (not on CPAP), prior neck surgeries. She experienced some R sided CP on 10/18 that sounds atypical. Prompted a CXR that showed a RUL nodule. A CT scan on 10/22 confirmed a RUL peripheral GG nodule without a predominant solid component. No overt pulmonary symptoms. The chest pain went away and hasn't returned. No cough, no fevers, no malaise, no wt loss.   Review of Systems  Constitutional: Negative for fever and unexpected weight change.  HENT: Negative for ear pain, nosebleeds, congestion, sore throat, rhinorrhea, sneezing, trouble swallowing, dental problem, postnasal drip and sinus pressure.   Eyes: Negative for redness and itching.  Respiratory: Positive for cough. Negative for chest tightness, shortness of breath and wheezing.   Cardiovascular: Negative for palpitations and leg swelling.  Gastrointestinal: Positive for diarrhea. Negative for nausea and vomiting.  Genitourinary: Negative for dysuria.  Musculoskeletal: Negative for joint swelling.  Skin: Negative for rash.  Neurological: Negative for headaches.  Hematological: Does not bruise/bleed easily.  Psychiatric/Behavioral: Negative for dysphoric mood. The patient is not nervous/anxious.    Past Medical History  Diagnosis Date  . Sleep apnea      Family History  Problem Relation Age of Onset  . Cancer Father     colon  . Stroke Paternal Grandfather   . Cancer Maternal Grandmother      History   Social History  . Marital Status: Married    Spouse Name: N/A    Number of Children: 1  . Years of Education: N/A   Occupational History  .  [employer]   Social History Main Topics  . Smoking status: Former Smoker -- 3 years    Types: Cigarettes    Quit date: 01/30/1971  . Smokeless tobacco: Never Used  . Alcohol Use: No  . Drug Use: No  . Sexually Active: Not on file    Other Topics Concern  . Not on file   Social History Narrative  . No narrative on file     Allergies  Allergen Reactions  . Penicillins      No outpatient prescriptions prior to visit.    Last reviewed on 12/06/2011  3:07 PM by Lazarus Salines, RN       Objective:   Physical Exam Filed Vitals:   12/06/11 1507  BP: 124/70  Pulse: 66  Temp: 98.2 F (36.8 C)   Gen: Pleasant, well-nourished, in no distress,  normal affect  ENT: No lesions,  mouth clear,  oropharynx clear, no postnasal drip  Neck: No JVD, no TMG, no carotid bruits  Lungs: No use of accessory muscles, no dullness to percussion, clear without rales or rhonchi  Cardiovascular: RRR, heart sounds normal, no murmur or gallops, no peripheral edema  Musculoskeletal: No deformities, no cyanosis or clubbing  Neuro: alert, non focal  Skin: Warm, no lesions or rashes     Assessment:      Pulmonary nodule RUL ground glass nodule. No solid component (yet). This is suspicious for BAC. If it gets larger or if it changes consistency on CT scan then I believe she needs PET scan and then TCTS referral to have it removed.

## 2011-12-07 ENCOUNTER — Telehealth: Payer: Self-pay | Admitting: Emergency Medicine

## 2011-12-07 DIAGNOSIS — R911 Solitary pulmonary nodule: Secondary | ICD-10-CM

## 2011-12-07 NOTE — Telephone Encounter (Signed)
Pulmonary nodule - BYRUM,ROBERT S., MD 12/06/2011 3:42 PM Signed  RUL ground glass nodule. No solid component (yet). This is suspicious for BAC. If it gets larger or if it changes consistency on CT scan then I believe she needs PET scan and then TCTS referral to have it removed.   Patient Instructions     We will repeat your CT scan of the chest in April 2014  Please follow with Dr Delton Coombes in April after the CT scan to review the results  If you develop any new symptoms such as cough, shortness of breath, chest pain then please contact our office to be evaluated sooner.     RB, please advise. Thanks.

## 2011-12-11 NOTE — Telephone Encounter (Signed)
Pt is calling back to let RB know she wants tyo go ahead and be seen by a Careers adviser. Pt aware we will forward msg to RB and call once this has been taken care or scheduled.

## 2011-12-11 NOTE — Telephone Encounter (Signed)
lmomtcb x1 

## 2011-12-11 NOTE — Telephone Encounter (Signed)
Patient is calling back about getting appt. With Careers adviser.

## 2011-12-12 NOTE — Telephone Encounter (Signed)
Will make the referral and order the PET scan now

## 2011-12-13 NOTE — Telephone Encounter (Signed)
Spoke with pt. She states that she is aware that referral was made- nothing further needed.

## 2011-12-20 ENCOUNTER — Encounter (HOSPITAL_COMMUNITY)
Admission: RE | Admit: 2011-12-20 | Discharge: 2011-12-20 | Disposition: A | Discharge status: Home / Self Care | Payer: 59 | Source: Ambulatory Visit | Attending: Emergency Medicine | Admitting: Emergency Medicine

## 2011-12-20 ENCOUNTER — Ambulatory Visit: Payer: Self-pay | Admitting: Obstetrics and Gynecology

## 2011-12-20 ENCOUNTER — Institutional Professional Consult (permissible substitution) (INDEPENDENT_AMBULATORY_CARE_PROVIDER_SITE_OTHER): Payer: 59 | Admitting: Thoracic Surgery (Cardiothoracic Vascular Surgery)

## 2011-12-20 ENCOUNTER — Encounter: Payer: Self-pay | Admitting: Thoracic Surgery (Cardiothoracic Vascular Surgery)

## 2011-12-20 VITALS — BP 134/80 | HR 92 | Resp 18 | Ht 61.5 in | Wt 125.0 lb

## 2011-12-20 DIAGNOSIS — R911 Solitary pulmonary nodule: Secondary | ICD-10-CM

## 2011-12-20 MED ORDER — FLUDEOXYGLUCOSE F - 18 (FDG) INJECTION
19.4000 | Freq: Once | INTRAVENOUS | Status: AC | PRN
  Administered 2011-12-20: 19.4 via INTRAVENOUS

## 2011-12-20 NOTE — Progress Notes (Signed)
Patient ID: Kristin [name], female   DOB: [birth year], 61 y.o.   MRN: [id number]  Mrs. Fencl is a 61 year old woman who was sent to [facility] for evaluation of a groundglass opacity in the right upper lobe.  She was found to have a groundglass opacity on CT scan in October.  She had a PET CT done earlier this morning which showed that the groundglass opacity had resolved. She had been treated with antibiotics in the meantime, so this likely was a localized infection.  Second finding on the PET CT was a cystic area in the pancreas. This was not hypermetabolic and was felt to possibly be a pseudocyst or cystic adenoma. A followup MRI in 6 months with the recommendation.   Diagnostic Studies CT Chest 11/19/11 CT CHEST WITH CONTRAST  Technique: Multidetector CT imaging of the chest was performed  following the standard protocol during bolus administration of  intravenous contrast.  Contrast: 80mL OMNIPAQUE IOHEXOL 300 MG/ML SOLN  Comparison: Chest radiographs dated 11/16/2011. CT chest dated  05/14/2003.  Findings: Radiographic abnormality corresponds to a 12 x 14 mm  subpleural ground-glass nodule/opacity in the lateral right upper  lobe (series 3/image 16). Mild dependent atelectasis in the  bilateral lower lobes. No pleural effusion or pneumothorax.  Visualized thyroid is unremarkable.  The heart is normal in size. No pericardial effusion.  No suspicious mediastinal, hilar, or axillary lymphadenopathy.  The visualized upper abdomen is unremarkable.  Visualized osseous structures are within normal limits.  IMPRESSION:  12 x 14 mm subpleural ground-glass nodule/opacity in the lateral  right upper lobe, corresponding to the sonographic abnormality.  While infection/inflammation is possible, the appearance is  worrisome for low grade adenocarcinoma. Initial follow-up CT chest  is suggested in 3 months to document persistence.  This recommendation follows the consensus statement:    Recommendations for the Management of Subsolid Pulmonary Nodules  Detected at CT: A Statement from the Fleischner Society.  Radiology 2013; 266:304-317.   PET/CT 12/20/11  NUCLEAR MEDICINE PET SKULL BASE TO THIGH  Fasting Blood Glucose: 113  Technique: 19.4 mCi F-18 FDG was injected intravenously. CT data  was obtained and used for attenuation correction and anatomic  localization only. (This was not acquired as a diagnostic CT  examination.) Additional exam technical data entered on  technologist worksheet.  Comparison: 11/19/2011  Findings:  Neck: No hypermetabolic lymph nodes in the neck.  Chest: No hypermetabolic mediastinal or hilar nodes. No  suspicious pulmonary nodules on the CT scan.  Abdomen/Pelvis: No abnormal hypermetabolic activity within the  liver, pancreas, adrenal glands, or spleen. No hypermetabolic  lymph nodes in the abdomen or pelvis. Fluid attenuation structure  within the head of pancreas measures 1.9 cm, image 132. No  significant FDG uptake is associated this structure. On the CT  from 04/05/2009 this measured 1.4 cm. Hyperdense cyst is noted  within the inferior pole the right kidney measuring approximately 7  mm, image 127.  Skeleton: No focal hypermetabolic activity to suggest skeletal  metastasis.  IMPRESSION:  1. Resolution of ground-glass nodule within the right upper lobe.  2. Fluid attenuation structure within the head of pancreas  demonstrates mild increased in size from 04/05/2009. Differential  for this appearance includes pseudocyst, branch duct IMPN, or  serous cystadenoma. Recommend follow-up MR or CT and 6 months.   Impression: Resolved groundglass opacity in right upper lobe, no further follow up needed for that issue.

## 2011-12-21 NOTE — Progress Notes (Signed)
Quick Note:  ATC patient, patient not home. Left message for patient to call office back. WCB ______

## 2011-12-24 ENCOUNTER — Telehealth: Payer: Self-pay | Admitting: Pulmonary Disease

## 2011-12-24 NOTE — Telephone Encounter (Signed)
Notes Recorded by Leslye Peer, MD on 12/20/2011 at 5:19 PM Please let the patient know that the nodular lesion has resolved on her PET scan. This is good news. She doesn't need to see the surgeons.   lmomtcb x1

## 2011-12-24 NOTE — Telephone Encounter (Signed)
I spoke with patient about results and she verbalized understanding and had no questions 

## 2012-01-11 ENCOUNTER — Encounter: Payer: Self-pay | Admitting: Obstetrics and Gynecology

## 2012-01-11 ENCOUNTER — Ambulatory Visit (INDEPENDENT_AMBULATORY_CARE_PROVIDER_SITE_OTHER): Payer: 59 | Admitting: Obstetrics and Gynecology

## 2012-01-11 VITALS — BP 122/68 | Ht 61.75 in | Wt 120.5 lb

## 2012-01-11 DIAGNOSIS — M810 Age-related osteoporosis without current pathological fracture: Secondary | ICD-10-CM

## 2012-01-11 DIAGNOSIS — Z124 Encounter for screening for malignant neoplasm of cervix: Secondary | ICD-10-CM

## 2012-01-11 DIAGNOSIS — Z01419 Encounter for gynecological examination (general) (routine) without abnormal findings: Secondary | ICD-10-CM

## 2012-01-11 MED ORDER — RISEDRONATE SODIUM 150 MG PO TABS: 150.0000 mg | ORAL_TABLET | ORAL | Status: DC

## 2012-01-11 NOTE — Progress Notes (Signed)
Subjective:    The patient has never been taking hormone replacement therapy The patient  is taking a Calcium supplement. And Actonel Post-menopausal bleeding:no  Last Pap: was normal October  2012 Last mammogram: was normal May  2013 Last DEXA scan : T= -2.04 November 2009 Pt will sched at later date Last colonoscopy:normal  2011  Urinary symptoms: none Normal bowel movements: Yes Reports abuse at home: No:   Subjective:    Kristin [name] is a 61 y.o. female No obstetric history on file. who presents for annual exam.  The patient has no complaints today.   The following portions of the patient's history were reviewed and updated as appropriate: allergies, current medications, past family history, past medical history, past social history, past surgical history and problem list.  Review of Systems Pertinent items are noted in HPI. Gastrointestinal:No change in bowel habits, no abdominal pain, no rectal bleeding Genitourinary:negative for dysuria, frequency, hematuria, nocturia and urinary incontinence    Objective:     There were no vitals taken for this visit.  Weight:  Wt Readings from Last 1 Encounters:  12/20/11 125 lb (56.7 kg)     BMI: There is no height or weight on file to calculate BMI. General Appearance: Alert, appropriate appearance for age. No acute distress HEENT: Grossly normal Neck / Thyroid: Supple, no masses, nodes or enlargement Lungs: clear to auscultation bilaterally Back: No CVA tenderness Breast Exam: No masses or nodes.No dimpling, nipple retraction or discharge. Cardiovascular: Regular rate and rhythm. S1, S2, no murmur Gastrointestinal: Soft, non-tender, no masses or organomegaly Pelvic Exam: Vulva and vagina appear normal. Bimanual exam reveals normal uterus and adnexa. Rectovaginal: normal rectal, no masses Lymphatic Exam: Non-palpable nodes in neck, clavicular, axillary, or inguinal regions Skin: no rash or abnormalities Neurologic: Normal  gait and speech, no tremor  Psychiatric: Alert and oriented, appropriate affect.     Assessment:    Normal gyn exam    Plan:   mammogram pap smear return annually or prn DEXA scan to schedule    Silverio Lay MD

## 2012-01-17 ENCOUNTER — Ambulatory Visit: Payer: 59

## 2012-01-17 ENCOUNTER — Ambulatory Visit (INDEPENDENT_AMBULATORY_CARE_PROVIDER_SITE_OTHER): Payer: 59

## 2012-01-17 DIAGNOSIS — M810 Age-related osteoporosis without current pathological fracture: Secondary | ICD-10-CM

## 2012-01-17 LAB — PAP IG W/ RFLX HPV ASCU

## 2012-01-18 ENCOUNTER — Telehealth: Payer: Self-pay

## 2012-01-18 LAB — VITAMIN D 25 HYDROXY (VIT D DEFICIENCY, FRACTURES): Vit D, 25-Hydroxy: 27 ng/mL — ABNORMAL LOW (ref 30–89)

## 2012-01-18 MED ORDER — ERGOCALCIFEROL 1.25 MG (50000 UT) PO CAPS: ORAL_CAPSULE | ORAL | Status: DC

## 2012-01-18 NOTE — Progress Notes (Signed)
Quick Note:  Please inform patient that Vitamin D is low. Initiate Vitamin D protocol. ______ 

## 2012-01-18 NOTE — Telephone Encounter (Signed)
Message copied by Darien Ramus on Fri Jan 18, 2012 12:51 PM ------      Message from: Larwance Rote      Created: Fri Jan 18, 2012 11:14 AM                   ----- Message -----         From: Esmeralda Arthur, MD         Sent: 01/18/2012   8:06 AM           To: Butler Denmark, CMA            Please inform patient that Vitamin D is low. Initiate Vitamin D protocol.

## 2012-01-18 NOTE — Telephone Encounter (Signed)
Vitamin D protocol. LVM   Darien Ramus, CMA

## 2012-04-08 ENCOUNTER — Ambulatory Visit (HOSPITAL_COMMUNITY): Payer: 59

## 2012-04-15 ENCOUNTER — Ambulatory Visit (HOSPITAL_COMMUNITY): Payer: 59

## 2012-04-17 ENCOUNTER — Ambulatory Visit (HOSPITAL_COMMUNITY)
Admission: RE | Admit: 2012-04-17 | Discharge: 2012-04-17 | Disposition: A | Discharge status: Home / Self Care | Payer: 59 | Source: Ambulatory Visit | Attending: Emergency Medicine | Admitting: Emergency Medicine

## 2012-04-17 DIAGNOSIS — R911 Solitary pulmonary nodule: Secondary | ICD-10-CM

## 2012-04-25 NOTE — Progress Notes (Signed)
Quick Note:  ATC patient, no answer [id number] ______ 

## 2012-04-28 ENCOUNTER — Telehealth: Payer: Self-pay | Admitting: Emergency Medicine

## 2012-04-28 NOTE — Telephone Encounter (Signed)
Pt aware of test results. Nothing further needed.  

## 2012-05-02 NOTE — Progress Notes (Signed)
Quick Note:  Spoke with patient, informed her of results/recs per RB. Patient verbalized understanding and nothing further needed at this time. ______

## 2012-05-27 ENCOUNTER — Other Ambulatory Visit: Payer: Self-pay

## 2012-05-27 DIAGNOSIS — Z1231 Encounter for screening mammogram for malignant neoplasm of breast: Secondary | ICD-10-CM

## 2012-06-20 ENCOUNTER — Ambulatory Visit
Admission: RE | Admit: 2012-06-20 | Discharge: 2012-06-20 | Disposition: A | Discharge status: Home / Self Care | Payer: 59 | Source: Ambulatory Visit

## 2012-06-20 ENCOUNTER — Ambulatory Visit: Payer: 59

## 2012-06-20 DIAGNOSIS — Z1231 Encounter for screening mammogram for malignant neoplasm of breast: Secondary | ICD-10-CM

## 2012-08-27 ENCOUNTER — Other Ambulatory Visit (HOSPITAL_COMMUNITY): Payer: Self-pay | Admitting: Neurosurgery

## 2012-08-27 DIAGNOSIS — M542 Cervicalgia: Secondary | ICD-10-CM

## 2012-09-08 ENCOUNTER — Ambulatory Visit (HOSPITAL_COMMUNITY): Payer: 59

## 2012-09-08 ENCOUNTER — Other Ambulatory Visit (HOSPITAL_COMMUNITY): Payer: 59

## 2012-09-19 ENCOUNTER — Ambulatory Visit (HOSPITAL_COMMUNITY)
Admission: RE | Admit: 2012-09-19 | Discharge: 2012-09-19 | Disposition: A | Discharge status: Home / Self Care | Payer: 59 | Source: Ambulatory Visit | Attending: Neurosurgery | Admitting: Neurosurgery

## 2012-09-19 DIAGNOSIS — M4802 Spinal stenosis, cervical region: Secondary | ICD-10-CM | POA: Insufficient documentation

## 2012-09-19 DIAGNOSIS — M4712 Other spondylosis with myelopathy, cervical region: Secondary | ICD-10-CM | POA: Insufficient documentation

## 2012-09-19 DIAGNOSIS — M542 Cervicalgia: Secondary | ICD-10-CM

## 2012-09-19 DIAGNOSIS — M47812 Spondylosis without myelopathy or radiculopathy, cervical region: Secondary | ICD-10-CM | POA: Insufficient documentation

## 2012-09-19 DIAGNOSIS — M25519 Pain in unspecified shoulder: Secondary | ICD-10-CM | POA: Insufficient documentation

## 2012-09-19 DIAGNOSIS — Q762 Congenital spondylolisthesis: Secondary | ICD-10-CM | POA: Insufficient documentation

## 2012-09-19 DIAGNOSIS — M5020 Other cervical disc displacement, unspecified cervical region: Secondary | ICD-10-CM | POA: Insufficient documentation

## 2012-09-19 DIAGNOSIS — R209 Unspecified disturbances of skin sensation: Secondary | ICD-10-CM | POA: Insufficient documentation

## 2012-09-19 MED ORDER — DIAZEPAM 5 MG PO TABS
ORAL_TABLET | ORAL | Status: AC
  Filled 2012-09-19: qty 2

## 2012-09-19 MED ORDER — OXYCODONE-ACETAMINOPHEN 5-325 MG PO TABS: 1.0000 | ORAL_TABLET | ORAL | Status: DC | PRN

## 2012-09-19 MED ORDER — IOHEXOL 300 MG/ML  SOLN: 10.0000 mL | Freq: Once | INTRAMUSCULAR | Status: DC | PRN

## 2012-09-19 MED ORDER — DIAZEPAM 5 MG PO TABS
10.0000 mg | ORAL_TABLET | Freq: Once | ORAL | Status: AC
  Administered 2012-09-19: 10 mg via ORAL

## 2012-09-19 MED ORDER — IOHEXOL 300 MG/ML  SOLN
10.0000 mL | Freq: Once | INTRAMUSCULAR | Status: AC | PRN
  Administered 2012-09-19: 10 mL via INTRATHECAL

## 2012-09-19 MED ORDER — ONDANSETRON HCL 4 MG/2ML IJ SOLN: 4.0000 mg | Freq: Four times a day (QID) | INTRAMUSCULAR | Status: DC | PRN

## 2012-09-19 NOTE — Op Note (Signed)
09/19/2012 cervical Myelogram  PATIENT:  Kristin [name] is a 62 y.o. female with neck and upper extremity pain.   PRE-OPERATIVE DIAGNOSIS:  cervicalgia  POST-OPERATIVE DIAGNOSIS:  cervicalgia  PROCEDURE:  Cervical Myelogram  SURGEON:  Azarya Oconnell  ANESTHESIA:   local LOCAL MEDICATIONS USED:  LIDOCAINE  and Amount: 6 ml Procedure Note: Mrs. Bacorn was positioned on the fluoroscopy table prone. Her back was prepped and draped in a sterile manner. I infiltrated 6cc lidocaine into the lumbar region at the level of L4/5. I introduced a spinal needle into the thecal sac at the L3/4 level, then infused 10cc 300 omnipaque into the spinal fluid. She tolerated the procedure without difficulty.    PATIENT DISPOSITION:  PACU - hemodynamically stable.

## 2013-08-21 ENCOUNTER — Other Ambulatory Visit: Payer: Self-pay

## 2013-08-21 DIAGNOSIS — Z1231 Encounter for screening mammogram for malignant neoplasm of breast: Secondary | ICD-10-CM

## 2013-08-26 ENCOUNTER — Encounter (INDEPENDENT_AMBULATORY_CARE_PROVIDER_SITE_OTHER): Payer: Self-pay

## 2013-08-26 ENCOUNTER — Ambulatory Visit
Admission: RE | Admit: 2013-08-26 | Discharge: 2013-08-26 | Disposition: A | Discharge status: Home / Self Care | Payer: 59 | Source: Ambulatory Visit

## 2013-08-26 DIAGNOSIS — Z1231 Encounter for screening mammogram for malignant neoplasm of breast: Secondary | ICD-10-CM

## 2013-11-29 ENCOUNTER — Telehealth: Payer: Self-pay | Admitting: Internal Medicine

## 2013-11-29 NOTE — Telephone Encounter (Signed)
Since 0300 having sharp and stabbing left infrascapular pain, initially severe. Mild after ibuprofen. No associated GI sxs at all. Movement or palpation do not cause or affect.  She wondered if it was "gas pain"  I told her does not seem so and if does not remit, worseins again evaluation today at urgent care or ED appropriate.  She does not seem to have pleuritic pain though might intensify with very deep breath.

## 2013-11-30 ENCOUNTER — Encounter: Payer: Self-pay | Admitting: Obstetrics and Gynecology

## 2014-03-29 ENCOUNTER — Other Ambulatory Visit: Payer: Self-pay | Admitting: Obstetrics and Gynecology

## 2014-03-29 DIAGNOSIS — Z1231 Encounter for screening mammogram for malignant neoplasm of breast: Secondary | ICD-10-CM

## 2014-03-29 DIAGNOSIS — M810 Age-related osteoporosis without current pathological fracture: Secondary | ICD-10-CM

## 2014-04-19 ENCOUNTER — Encounter (HOSPITAL_COMMUNITY): Payer: Self-pay | Admitting: Emergency Medicine

## 2014-04-19 ENCOUNTER — Emergency Department (INDEPENDENT_AMBULATORY_CARE_PROVIDER_SITE_OTHER)
Admission: EM | Admit: 2014-04-19 | Discharge: 2014-04-19 | Disposition: A | Discharge status: Home / Self Care | Payer: 59 | Source: Home / Self Care | Attending: Emergency Medicine | Admitting: Emergency Medicine

## 2014-04-19 DIAGNOSIS — L0100 Impetigo, unspecified: Secondary | ICD-10-CM | POA: Diagnosis not present

## 2014-04-19 DIAGNOSIS — L209 Atopic dermatitis, unspecified: Secondary | ICD-10-CM | POA: Diagnosis not present

## 2014-04-19 HISTORY — DX: Disorder of thyroid, unspecified: E07.9

## 2014-04-19 MED ORDER — NYSTATIN-TRIAMCINOLONE 100000-0.1 UNIT/GM-% EX CREA
TOPICAL_CREAM | CUTANEOUS | Status: DC
Start: 2014-04-19 — End: 2014-08-04

## 2014-04-19 MED ORDER — MINOCYCLINE HCL 100 MG PO CAPS: 100.0000 mg | ORAL_CAPSULE | Freq: Two times a day (BID) | ORAL | Status: DC

## 2014-04-19 NOTE — Discharge Instructions (Signed)
Eczema Eczema, also called atopic dermatitis, is a skin disorder that causes inflammation of the skin. It causes a red rash and dry, scaly skin. The skin becomes very itchy. Eczema is generally worse during the cooler winter months and often improves with the warmth of summer. Eczema usually starts showing signs in infancy. Some children outgrow eczema, but it may last through adulthood.  CAUSES  The exact cause of eczema is not known, but it appears to run in families. People with eczema often have a family history of eczema, allergies, asthma, or hay fever. Eczema is not contagious. Flare-ups of the condition may be caused by:   Contact with something you are sensitive or allergic to.   Stress. SIGNS AND SYMPTOMS  Dry, scaly skin.   Red, itchy rash.   Itchiness. This may occur before the skin rash and may be very intense.  DIAGNOSIS  The diagnosis of eczema is usually made based on symptoms and medical history. TREATMENT  Eczema cannot be cured, but symptoms usually can be controlled with treatment and other strategies. A treatment plan might include:  Controlling the itching and scratching.   Use over-the-counter antihistamines as directed for itching. This is especially useful at night when the itching tends to be worse.   Use over-the-counter steroid creams as directed for itching.   Avoid scratching. Scratching makes the rash and itching worse. It may also result in a skin infection (impetigo) due to a break in the skin caused by scratching.   Keeping the skin well moisturized with creams every day. This will seal in moisture and help prevent dryness. Lotions that contain alcohol and water should be avoided because they can dry the skin.   Limiting exposure to things that you are sensitive or allergic to (allergens).   Recognizing situations that cause stress.   Developing a plan to manage stress.  HOME CARE INSTRUCTIONS   Only take over-the-counter or  prescription medicines as directed by your health care provider.   Do not use anything on the skin without checking with your health care provider.   Keep baths or showers short (5 minutes) in warm (not hot) water. Use mild cleansers for bathing. These should be unscented. You may add nonperfumed bath oil to the bath water. It is best to avoid soap and bubble bath.   Immediately after a bath or shower, when the skin is still damp, apply a moisturizing ointment to the entire body. This ointment should be a petroleum ointment. This will seal in moisture and help prevent dryness. The thicker the ointment, the better. These should be unscented.   Keep fingernails cut short. Children with eczema may need to wear soft gloves or mittens at night after applying an ointment.   Dress in clothes made of cotton or cotton blends. Dress lightly, because heat increases itching.   A child with eczema should stay away from anyone with fever blisters or cold sores. The virus that causes fever blisters (herpes simplex) can cause a serious skin infection in children with eczema. SEEK MEDICAL CARE IF:   Your itching interferes with sleep.   Your rash gets worse or is not better within 1 week after starting treatment.   You see pus or soft yellow scabs in the rash area.   You have a fever.   You have a rash flare-up after contact with someone who has fever blisters.  Document Released: 01/13/2000 Document Revised: 11/05/2012 Document Reviewed: 08/18/2012 ExitCare Patient Information 2015 [employer]. This information   is not intended to replace advice given to you by your health care provider. Make sure you discuss any questions you have with your health care provider.  

## 2014-04-19 NOTE — ED Notes (Signed)
C/o rash on left arm onset Wednesday Denies itching but reports pain Also denies fevers, chills Alert, no signs of acute distress.

## 2014-04-19 NOTE — ED Provider Notes (Signed)
CSN: [id number]     Arrival date & time 04/19/14  8938 History   First MD Initiated Contact with Patient 04/19/14 0845     Chief Complaint  Patient presents with  . 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 Area had only increased in size over last several days, remains slightly pruritic and has developed multiple areas of yellow fluid filled blisters. No fever/chills. No hx of DM. No malaise. PCP; Dr. Hilma Favors  Patient is a 64 y.o. female presenting with rash. The history is provided by the patient.  Rash   Past Medical History  Diagnosis Date  . Sleep apnea   . Thyroid disease    Past Surgical History  Procedure Laterality Date  . Back surgery  1997/1999  . Tubal ligation  1980  . Cystectomy  1972    brreast   Family History  Problem Relation Age of Onset  . Colon cancer Father   . Stroke Paternal Grandfather   . Cirrhosis Maternal Grandmother     liver  . Hypertension Mother    History  Substance Use Topics  . Smoking status: Former Smoker -- 3 years    Types: Cigarettes    Quit date: 01/30/1971  . Smokeless tobacco: Never Used  . Alcohol Use: No   OB History    Gravida Para Term Preterm AB TAB SAB Ectopic Multiple Living   1 1 0 0 0 0 0 0 0 0      Review of Systems  Skin: Positive for rash.  All other systems reviewed and are negative.   Allergies  Penicillins  Home Medications   Prior to Admission medications   Medication Sig Start Date End Date Taking? Authorizing Provider  rosuvastatin (CRESTOR) 5 MG tablet Take 5 mg by mouth every evening.   Yes Historical Provider, MD  aspirin EC 81 MG tablet Take 81 mg by mouth every evening.    Historical Provider, MD  beta carotene w/minerals (OCUVITE) tablet Take 1 tablet by mouth every evening.     Historical Provider, MD  Cholecalciferol (VITAMIN D) 2000 UNITS tablet Take 2,000 Units by mouth every evening.    Historical Provider, MD  cycloSPORINE (RESTASIS) 0.05 % ophthalmic emulsion Place 1 drop into both eyes 2 (two) times daily.    Historical Provider, MD  ibuprofen (ADVIL,MOTRIN) 200 MG tablet Take 400 mg by mouth once.    Historical Provider, MD  minocycline (MINOCIN) 100 MG capsule Take 1 capsule (100 mg total) by mouth 2 (two) times daily. 04/19/14   Lutricia Feil, PA  nystatin-triamcinolone [facility] II) cream Apply to affected area BID 04/19/14   Lutricia Feil, PA  OVER THE COUNTER MEDICATION Take 1 tablet by mouth every evening. Mega Red 500 mg    Historical Provider, MD  OVER THE COUNTER MEDICATION Take 1 tablet by mouth every evening. Generic stool softner    Historical Provider, MD  Oyster Shell (OYSTER CALCIUM) 500 MG TABS tablet Take 500 mg of elemental calcium by mouth every evening.    Historical Provider, MD  risedronate (ACTONEL) 150 MG tablet Take 1 tablet (150 mg total) by mouth every 30 (thirty) days. with water on empty stomach, nothing by mouth or lie down for next 30 minutes. 01/11/12   Delsa Bern, MD   BP 144/86 mmHg  Pulse 95  Temp(Src) 98.2  F (36.8 C) (Oral)  Resp 16  SpO2 95% Physical Exam  Constitutional: She is oriented to person, place, and time. She appears well-developed and well-nourished. No distress.  HENT:  Head: Normocephalic and atraumatic.  Eyes: Conjunctivae are normal.  Cardiovascular: Normal rate.   Pulmonary/Chest: Effort normal.  Musculoskeletal: Normal range of motion.  Neurological: She is alert and oriented to person, place, and time.  Skin: Skin is warm and dry. Rash noted.     Erythematous macular confluent rash with several scattered small yellow fluid filled blisters. Few small scattered erythematous satellite lesions. No induration or fluctuance  Psychiatric: She has a normal mood and affect. Her behavior  is normal.  Nursing note and vitals reviewed.   ED Course  Procedures (including critical care time) Labs Review Labs Reviewed - No data to display  Imaging Review No results found.   MDM   1. Atopic dermatitis   2. Impetigo   Atopic dermatitis that appears to have become secondarily infected. Will treat with topical Mycolog cream and oral minocin and advise PCP follow up if no improvement.     [place], [place] 04/19/14 [phone]

## 2014-05-12 IMAGING — CT CT CHEST W/ CM
2 of 3 series · 15 of 36 positions shown, 18 images · IV contrast (Omnipaque 300)
Comparison: Chest radiographs dated 11/16/2011.  CT chest dated
05/14/2003.

CLINICAL DATA: Chest pain, abnormal chest radiograph

CT CHEST WITH CONTRAST
TECHNIQUE: Multidetector CT imaging of the chest was performed
following the standard protocol during bolus administration of
intravenous contrast.
Contrast: 80mL OMNIPAQUE IOHEXOL 300 MG/ML  SOLN

[Series 2: chestroutine 5.0 b40f · axial · 0.61mm/px · z∈[-275,-35]mm · 12 of 58 slices shown, 15 images]
[im 5/58  mediastinal]
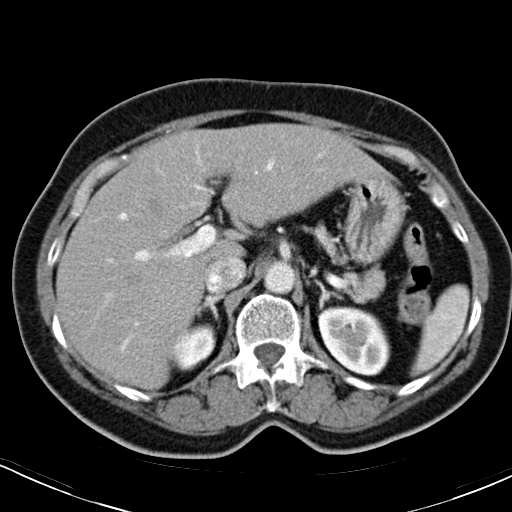
[im 5/58  lung]
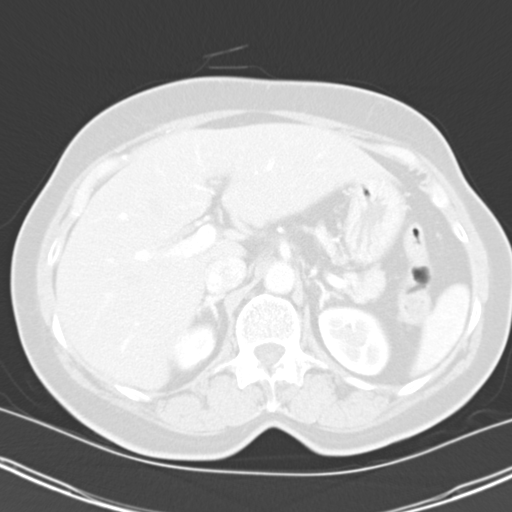
[im 9/58  lung]
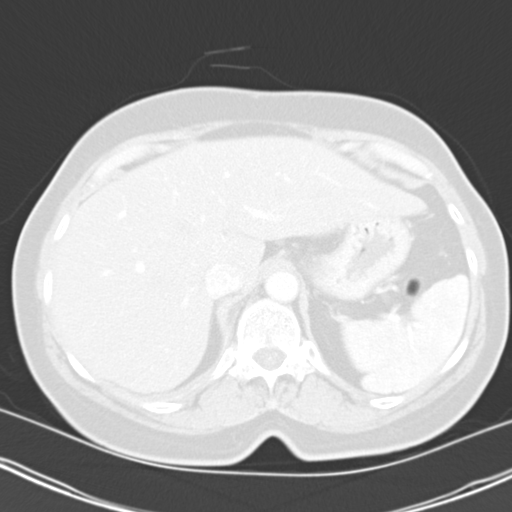
[im 13/58  lung]
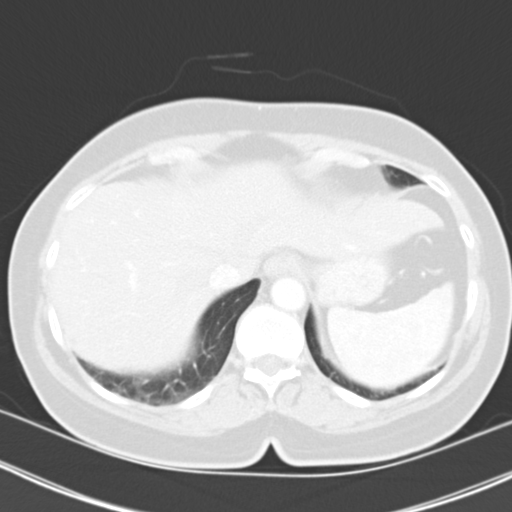
[im 17/58  lung]
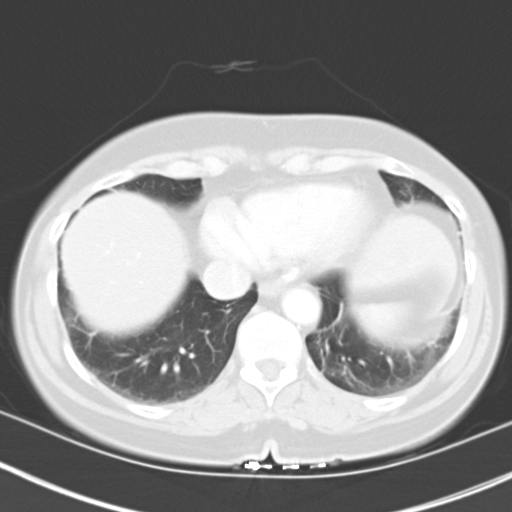
[im 22/58  mediastinal]
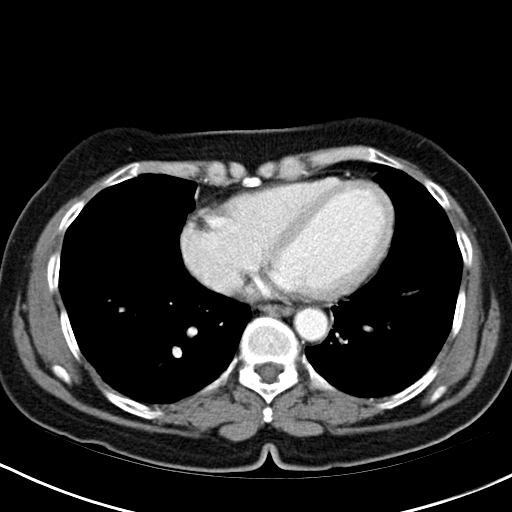
[im 22/58  lung]
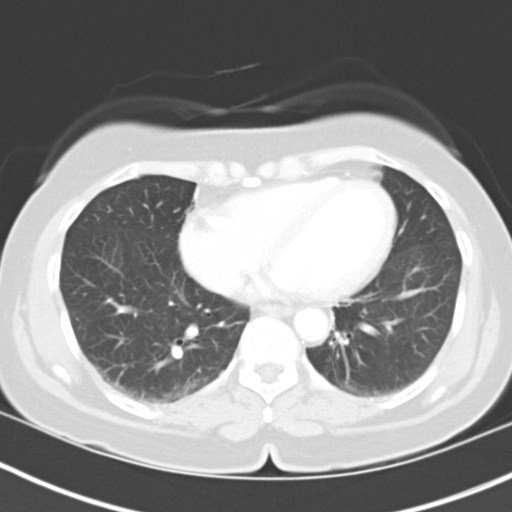
[im 26/58  lung]
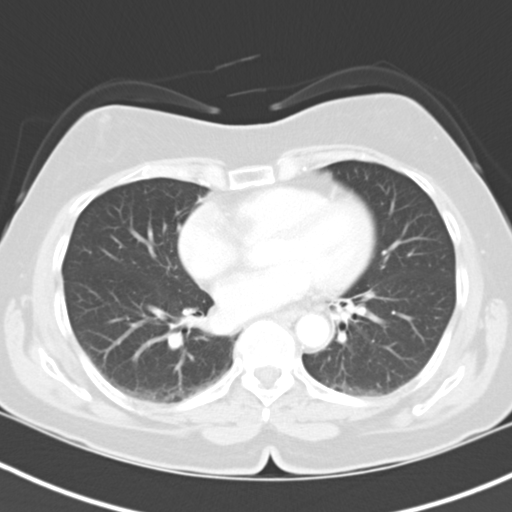
[im 32/58  lung]
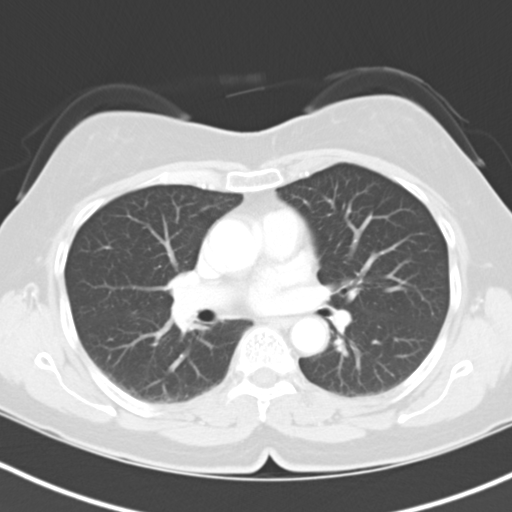
[im 36/58  lung]
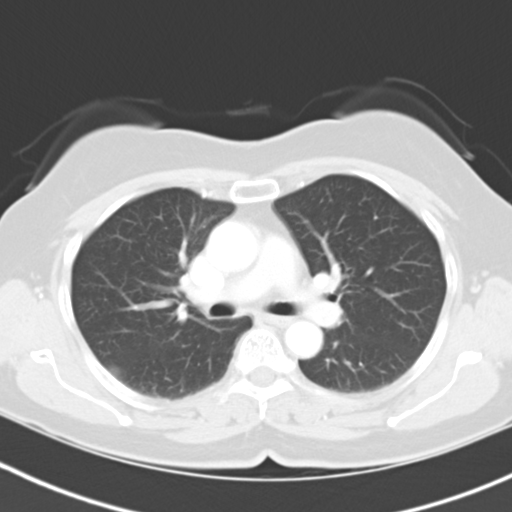
[im 41/58  mediastinal]
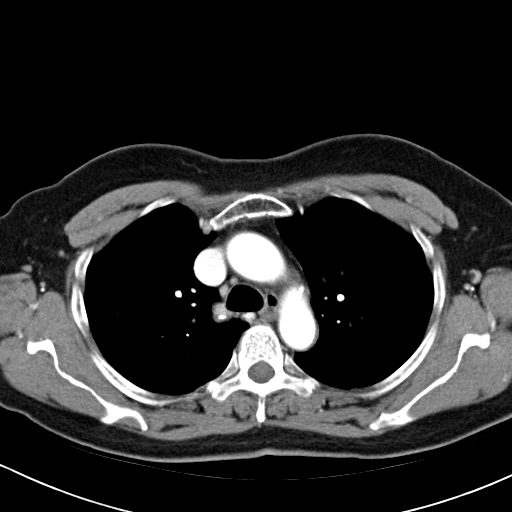
[im 41/58  lung]
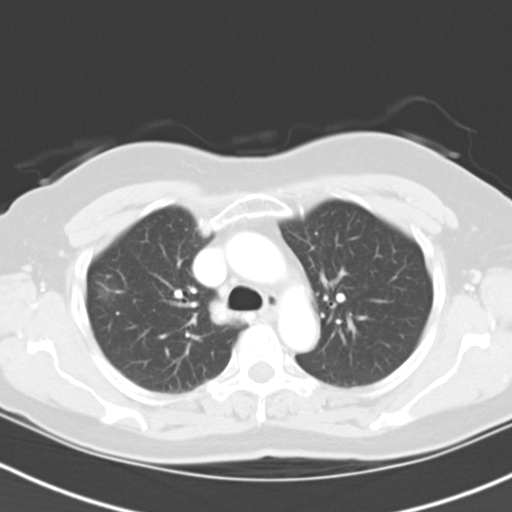
[im 45/58  lung]
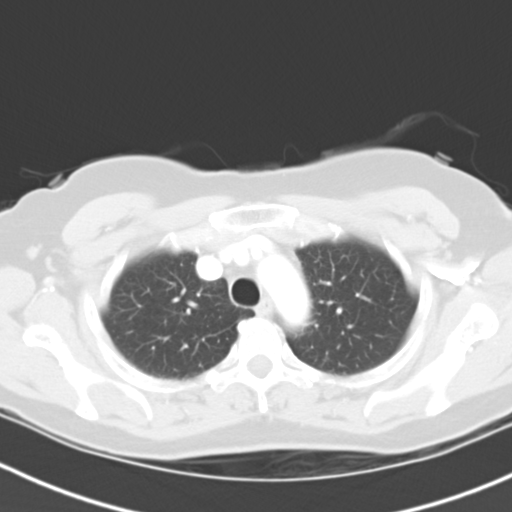
[im 49/58  lung]
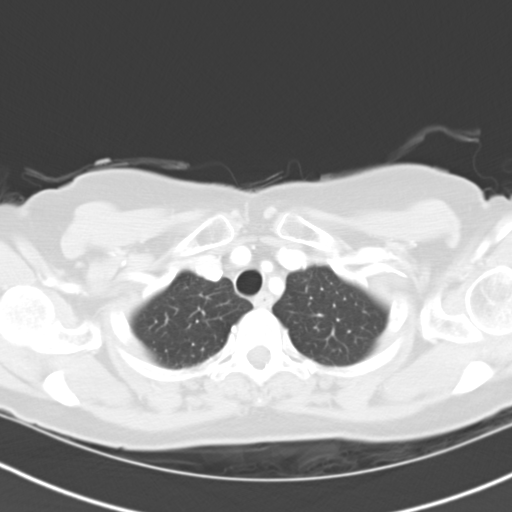
[im 53/58  lung]
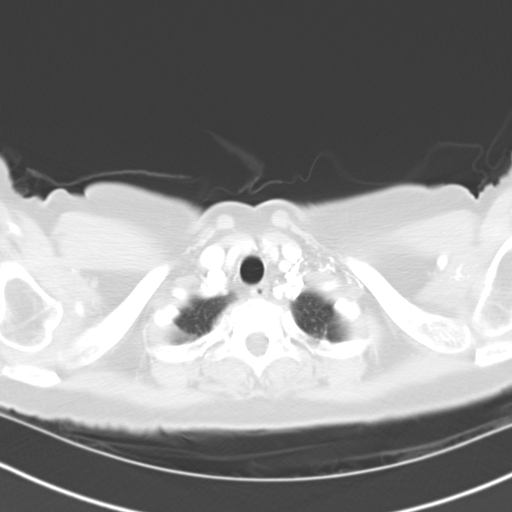

[Series 4: mpr coronal chest 3mm · coronal · 0.57mm/px · 3 of 74 slices shown]
[im 15/74  lung]
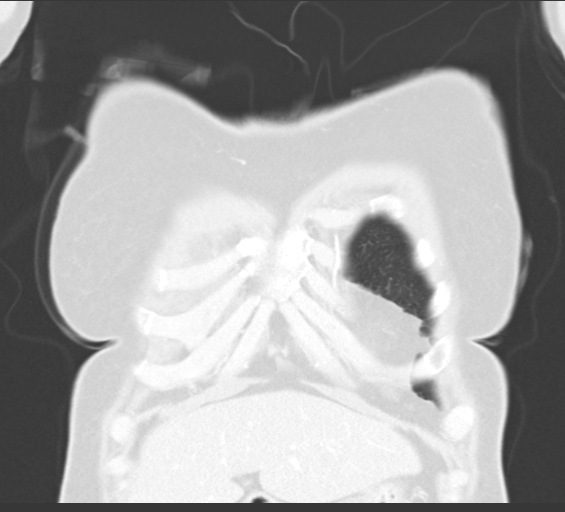
[im 30/74  lung]
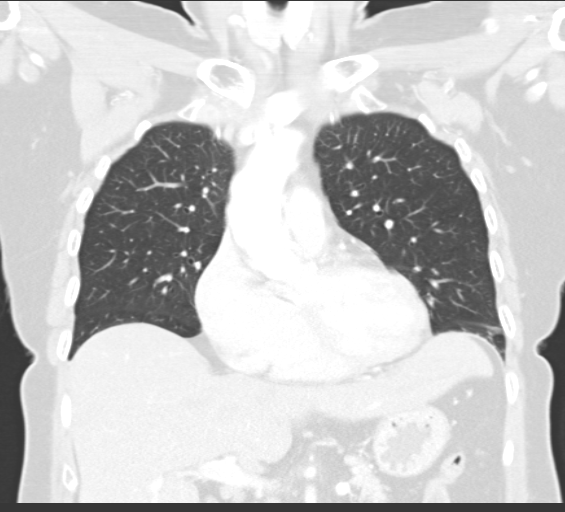
[im 44/74  lung]
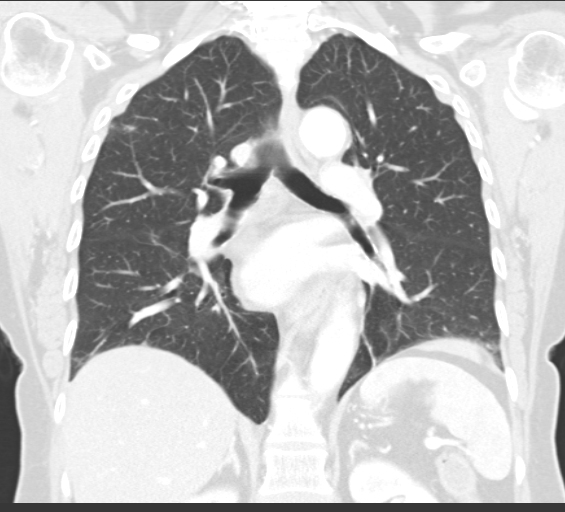

[15 of 36 positions shown; findings below may reference images not displayed]

FINDINGS: Radiographic abnormality corresponds to a 12 x 14 mm
subpleural ground-glass nodule/opacity in the lateral right upper
lobe (series 3/image 16).  Mild dependent atelectasis in the
bilateral lower lobes. No pleural effusion or pneumothorax.

Visualized thyroid is unremarkable.

The heart is normal in size.  No pericardial effusion.

No suspicious mediastinal, hilar, or axillary lymphadenopathy.

The visualized upper abdomen is unremarkable.

Visualized osseous structures are within normal limits.
IMPRESSION: 12 x 14 mm subpleural ground-glass nodule/opacity in the lateral
right upper lobe, corresponding to the sonographic abnormality.
While infection/inflammation is possible, the appearance is
worrisome for low grade adenocarcinoma.  Initial follow-up CT chest
is suggested in 3 months to document persistence.

This recommendation follows the consensus statement:
Recommendations for the Management of Subsolid Pulmonary Nodules
Detected at CT:  A Statement from the [HOSPITAL].

## 2014-06-28 ENCOUNTER — Other Ambulatory Visit (HOSPITAL_COMMUNITY): Payer: Self-pay | Admitting: Family Medicine

## 2014-06-28 DIAGNOSIS — R42 Dizziness and giddiness: Secondary | ICD-10-CM

## 2014-07-08 ENCOUNTER — Ambulatory Visit (HOSPITAL_COMMUNITY)
Admission: RE | Admit: 2014-07-08 | Discharge: 2014-07-08 | Disposition: A | Discharge status: Home / Self Care | Payer: 59 | Source: Ambulatory Visit | Attending: Family Medicine | Admitting: Family Medicine

## 2014-07-08 DIAGNOSIS — R42 Dizziness and giddiness: Secondary | ICD-10-CM | POA: Diagnosis not present

## 2014-07-08 LAB — POCT I-STAT, CHEM 8
BUN: 26 mg/dL — ABNORMAL HIGH (ref 6–20)
Calcium, Ion: 1.23 mmol/L (ref 1.13–1.30)
Chloride: 100 mmol/L — ABNORMAL LOW (ref 101–111)
Creatinine, Ser: 0.9 mg/dL (ref 0.44–1.00)
Glucose, Bld: 184 mg/dL — ABNORMAL HIGH (ref 65–99)
HCT: 45 % (ref 36.0–46.0)
HEMOGLOBIN: 15.3 g/dL — AB (ref 12.0–15.0)
POTASSIUM: 4.2 mmol/L (ref 3.5–5.1)
Sodium: 139 mmol/L (ref 135–145)
TCO2: 25 mmol/L (ref 0–100)

## 2014-07-08 MED ORDER — GADOBENATE DIMEGLUMINE 529 MG/ML IV SOLN
11.0000 mL | Freq: Once | INTRAVENOUS | Status: AC | PRN
  Administered 2014-07-08: 11 mL via INTRAVENOUS

## 2014-07-21 ENCOUNTER — Other Ambulatory Visit: Payer: Self-pay | Admitting: Otolaryngology

## 2014-08-04 ENCOUNTER — Encounter (HOSPITAL_BASED_OUTPATIENT_CLINIC_OR_DEPARTMENT_OTHER): Payer: Self-pay | Admitting: *Deleted

## 2014-08-09 ENCOUNTER — Ambulatory Visit (HOSPITAL_BASED_OUTPATIENT_CLINIC_OR_DEPARTMENT_OTHER)
Admission: RE | Admit: 2014-08-09 | Discharge: 2014-08-09 | Disposition: A | Discharge status: Home / Self Care | Payer: 59 | Source: Ambulatory Visit | Attending: Otolaryngology | Admitting: Otolaryngology

## 2014-08-09 ENCOUNTER — Ambulatory Visit (HOSPITAL_BASED_OUTPATIENT_CLINIC_OR_DEPARTMENT_OTHER): Payer: 59 | Admitting: Anesthesiology

## 2014-08-09 ENCOUNTER — Encounter (HOSPITAL_BASED_OUTPATIENT_CLINIC_OR_DEPARTMENT_OTHER): Payer: Self-pay

## 2014-08-09 ENCOUNTER — Encounter (HOSPITAL_BASED_OUTPATIENT_CLINIC_OR_DEPARTMENT_OTHER)
Admission: RE | Disposition: A | Discharge status: Home / Self Care | Payer: Self-pay | Source: Ambulatory Visit | Attending: Otolaryngology

## 2014-08-09 DIAGNOSIS — G4730 Sleep apnea, unspecified: Secondary | ICD-10-CM | POA: Insufficient documentation

## 2014-08-09 DIAGNOSIS — J320 Chronic maxillary sinusitis: Secondary | ICD-10-CM | POA: Diagnosis present

## 2014-08-09 DIAGNOSIS — B49 Unspecified mycosis: Secondary | ICD-10-CM | POA: Insufficient documentation

## 2014-08-09 DIAGNOSIS — R42 Dizziness and giddiness: Secondary | ICD-10-CM | POA: Diagnosis present

## 2014-08-09 DIAGNOSIS — Z87891 Personal history of nicotine dependence: Secondary | ICD-10-CM | POA: Insufficient documentation

## 2014-08-09 DIAGNOSIS — E039 Hypothyroidism, unspecified: Secondary | ICD-10-CM | POA: Insufficient documentation

## 2014-08-09 HISTORY — DX: Hypothyroidism, unspecified: E03.9

## 2014-08-09 HISTORY — DX: Age-related osteoporosis without current pathological fracture: M81.0

## 2014-08-09 HISTORY — PX: MAXILLARY ANTROSTOMY: SHX2003

## 2014-08-09 HISTORY — PX: NASAL SINUS SURGERY: SHX719

## 2014-08-09 HISTORY — DX: Dizziness and giddiness: R42

## 2014-08-09 LAB — POCT HEMOGLOBIN-HEMACUE: Hemoglobin: 15.2 g/dL — ABNORMAL HIGH (ref 12.0–15.0)

## 2014-08-09 SURGERY — MAXILLARY ANTROSTOMY
Anesthesia: General | Site: Nose | Laterality: Left

## 2014-08-09 MED ORDER — SUCCINYLCHOLINE CHLORIDE 20 MG/ML IJ SOLN
INTRAMUSCULAR | Status: DC | PRN
  Administered 2014-08-09: 100 mg via INTRAVENOUS

## 2014-08-09 MED ORDER — PROMETHAZINE HCL 25 MG/ML IJ SOLN: 6.2500 mg | INTRAMUSCULAR | Status: DC | PRN

## 2014-08-09 MED ORDER — LACTATED RINGERS IV SOLN
INTRAVENOUS | Status: DC
  Administered 2014-08-09 (×2): via INTRAVENOUS

## 2014-08-09 MED ORDER — DEXAMETHASONE SODIUM PHOSPHATE 4 MG/ML IJ SOLN
INTRAMUSCULAR | Status: DC | PRN
  Administered 2014-08-09: 10 mg via INTRAVENOUS

## 2014-08-09 MED ORDER — OXYMETAZOLINE HCL 0.05 % NA SOLN
NASAL | Status: DC | PRN
  Administered 2014-08-09: 1 via TOPICAL

## 2014-08-09 MED ORDER — OXYCODONE-ACETAMINOPHEN 5-325 MG PO TABS: 1.0000 | ORAL_TABLET | ORAL | Status: DC | PRN

## 2014-08-09 MED ORDER — MIDAZOLAM HCL 2 MG/2ML IJ SOLN
INTRAMUSCULAR | Status: AC
  Filled 2014-08-09: qty 2

## 2014-08-09 MED ORDER — FENTANYL CITRATE (PF) 100 MCG/2ML IJ SOLN
INTRAMUSCULAR | Status: AC
  Filled 2014-08-09: qty 6

## 2014-08-09 MED ORDER — GLYCOPYRROLATE 0.2 MG/ML IJ SOLN: 0.2000 mg | Freq: Once | INTRAMUSCULAR | Status: DC | PRN

## 2014-08-09 MED ORDER — HYDROMORPHONE HCL 1 MG/ML IJ SOLN
INTRAMUSCULAR | Status: AC
  Filled 2014-08-09: qty 1

## 2014-08-09 MED ORDER — LIDOCAINE HCL (CARDIAC) 20 MG/ML IV SOLN
INTRAVENOUS | Status: DC | PRN
  Administered 2014-08-09: 50 mg via INTRAVENOUS

## 2014-08-09 MED ORDER — PROPOFOL 10 MG/ML IV BOLUS
INTRAVENOUS | Status: DC | PRN
Start: 2014-08-09 — End: 2014-08-09
  Administered 2014-08-09: 150 mg via INTRAVENOUS

## 2014-08-09 MED ORDER — FLUCONAZOLE 150 MG PO TABS: 150.0000 mg | ORAL_TABLET | Freq: Every day | ORAL | Status: DC

## 2014-08-09 MED ORDER — OXYMETAZOLINE HCL 0.05 % NA SOLN
NASAL | Status: AC
  Filled 2014-08-09: qty 15

## 2014-08-09 MED ORDER — PROPOFOL 10 MG/ML IV BOLUS
INTRAVENOUS | Status: AC
  Filled 2014-08-09: qty 60

## 2014-08-09 MED ORDER — BACITRACIN ZINC 500 UNIT/GM EX OINT
TOPICAL_OINTMENT | CUTANEOUS | Status: DC | PRN
  Administered 2014-08-09: 1 via TOPICAL

## 2014-08-09 MED ORDER — MEPERIDINE HCL 25 MG/ML IJ SOLN: 6.2500 mg | INTRAMUSCULAR | Status: DC | PRN

## 2014-08-09 MED ORDER — CLINDAMYCIN HCL 300 MG PO CAPS: 300.0000 mg | ORAL_CAPSULE | Freq: Three times a day (TID) | ORAL | Status: DC

## 2014-08-09 MED ORDER — FENTANYL CITRATE (PF) 100 MCG/2ML IJ SOLN
50.0000 ug | INTRAMUSCULAR | Status: DC | PRN
  Administered 2014-08-09: 100 ug via INTRAVENOUS

## 2014-08-09 MED ORDER — LIDOCAINE-EPINEPHRINE 1 %-1:100000 IJ SOLN
INTRAMUSCULAR | Status: DC | PRN
  Administered 2014-08-09: 1.5 mL

## 2014-08-09 MED ORDER — BACITRACIN ZINC 500 UNIT/GM EX OINT
TOPICAL_OINTMENT | CUTANEOUS | Status: AC
  Filled 2014-08-09: qty 28.35

## 2014-08-09 MED ORDER — LIDOCAINE-EPINEPHRINE 1 %-1:100000 IJ SOLN
INTRAMUSCULAR | Status: AC
  Filled 2014-08-09: qty 1

## 2014-08-09 MED ORDER — ONDANSETRON HCL 4 MG/2ML IJ SOLN
INTRAMUSCULAR | Status: DC | PRN
  Administered 2014-08-09: 4 mg via INTRAVENOUS

## 2014-08-09 MED ORDER — MIDAZOLAM HCL 2 MG/2ML IJ SOLN
1.0000 mg | INTRAMUSCULAR | Status: DC | PRN
  Administered 2014-08-09: 2 mg via INTRAVENOUS

## 2014-08-09 MED ORDER — SCOPOLAMINE 1 MG/3DAYS TD PT72: 1.0000 | MEDICATED_PATCH | Freq: Once | TRANSDERMAL | Status: DC | PRN

## 2014-08-09 MED ORDER — HYDROMORPHONE HCL 1 MG/ML IJ SOLN
0.2500 mg | INTRAMUSCULAR | Status: DC | PRN
  Administered 2014-08-09 (×2): 0.5 mg via INTRAVENOUS

## 2014-08-09 MED ORDER — SUCCINYLCHOLINE CHLORIDE 20 MG/ML IJ SOLN
INTRAMUSCULAR | Status: AC
  Filled 2014-08-09: qty 2

## 2014-08-09 MED ORDER — MIDAZOLAM HCL 2 MG/2ML IJ SOLN: 0.5000 mg | Freq: Once | INTRAMUSCULAR | Status: DC | PRN

## 2014-08-09 SURGICAL SUPPLY — 37 items
ATTRACTOMAT 16X20 MAGNETIC DRP (DRAPES) IMPLANT
BLADE RAD40 ROTATE 4M 4 5PK (BLADE) IMPLANT
BLADE RAD60 ROTATE M4 4 5PK (BLADE) IMPLANT
BLADE TRICUT ROTATE M4 4 5PK (BLADE) ×1 IMPLANT
CANISTER SUC SOCK COL 7IN (MISCELLANEOUS) ×4 IMPLANT
CANISTER SUCT 1200ML W/VALVE (MISCELLANEOUS) ×2 IMPLANT
COAGULATOR SUCT 8FR VV (MISCELLANEOUS) IMPLANT
COAGULATOR SUCT SWTCH 10FR 6 (ELECTROSURGICAL) IMPLANT
DECANTER SPIKE VIAL GLASS SM (MISCELLANEOUS) IMPLANT
DRSG NASOPORE 8CM (GAUZE/BANDAGES/DRESSINGS) ×1 IMPLANT
DRSG TELFA 3X8 NADH (GAUZE/BANDAGES/DRESSINGS) IMPLANT
ELECT REM PT RETURN 9FT ADLT (ELECTROSURGICAL)
ELECTRODE REM PT RTRN 9FT ADLT (ELECTROSURGICAL) IMPLANT
GLOVE BIO SURGEON STRL SZ7.5 (GLOVE) ×2 IMPLANT
GOWN STRL REUS W/ TWL LRG LVL3 (GOWN DISPOSABLE) ×2 IMPLANT
GOWN STRL REUS W/TWL LRG LVL3 (GOWN DISPOSABLE) ×4
HEMOSTAT SURGICEL 2X14 (HEMOSTASIS) IMPLANT
IV NS 500ML (IV SOLUTION) ×2
IV NS 500ML BAXH (IV SOLUTION) ×1 IMPLANT
IV SET EXT 30 76VOL 4 MALE LL (IV SETS) IMPLANT
NDL HYPO 25X1 1.5 SAFETY (NEEDLE) ×1 IMPLANT
NEEDLE HYPO 25X1 1.5 SAFETY (NEEDLE) ×2 IMPLANT
NS IRRIG 1000ML POUR BTL (IV SOLUTION) IMPLANT
PACK BASIN DAY SURGERY FS (CUSTOM PROCEDURE TRAY) ×2 IMPLANT
PACK ENT DAY SURGERY (CUSTOM PROCEDURE TRAY) ×2 IMPLANT
PAD DRESSING TELFA 3X8 NADH (GAUZE/BANDAGES/DRESSINGS) IMPLANT
SLEEVE SCD COMPRESS KNEE MED (MISCELLANEOUS) IMPLANT
SOLUTION BUTLER CLEAR DIP (MISCELLANEOUS) ×4 IMPLANT
SPLINT NASAL AIRWAY SILICONE (MISCELLANEOUS) IMPLANT
SPONGE GAUZE 2X2 8PLY STRL LF (GAUZE/BANDAGES/DRESSINGS) ×2 IMPLANT
SPONGE NEURO XRAY DETECT 1X3 (DISPOSABLE) ×2 IMPLANT
SUT ETHILON 3 0 PS 1 (SUTURE) IMPLANT
SUT PLAIN 4 0 ~~LOC~~ 1 (SUTURE) IMPLANT
TOWEL OR 17X24 6PK STRL BLUE (TOWEL DISPOSABLE) ×2 IMPLANT
TUBE CONNECTING 20X1/4 (TUBING) ×3 IMPLANT
TUBE SALEM SUMP 16 FR W/ARV (TUBING) IMPLANT
YANKAUER SUCT BULB TIP NO VENT (SUCTIONS) IMPLANT

## 2014-08-09 NOTE — Anesthesia Preprocedure Evaluation (Addendum)
Anesthesia Evaluation  Patient identified by MRN, date of birth, ID band Patient awake    Reviewed: Allergy & Precautions, NPO status , Patient's Chart, lab work & pertinent test results  History of Anesthesia Complications Negative for: history of anesthetic complications  Airway Mallampati: II  TM Distance: >3 FB Neck ROM: Full    Dental  (+) Caps, Dental Advisory Given   Pulmonary sleep apnea (does not require CPAP) , former smoker (quiy 1973),  breath sounds clear to auscultation        Cardiovascular negative cardio ROS  Rhythm:Regular Rate:Normal     Neuro/Psych negative neurological ROS     GI/Hepatic negative GI ROS, Neg liver ROS,   Endo/Other  Hypothyroidism   Renal/GU negative Renal ROS     Musculoskeletal   Abdominal   Peds  Hematology negative hematology ROS (+)   Anesthesia Other Findings   Reproductive/Obstetrics                            Anesthesia Physical Anesthesia Plan  ASA: II  Anesthesia Plan: General   Post-op Pain Management:    Induction: Intravenous  Airway Management Planned: Oral ETT  Additional Equipment:   Intra-op Plan:   Post-operative Plan: Extubation in OR  Informed Consent: I have reviewed the patients History and Physical, chart, labs and discussed the procedure including the risks, benefits and alternatives for the proposed anesthesia with the patient or authorized representative who has indicated his/her understanding and acceptance.   Dental advisory given  Plan Discussed with: CRNA and Surgeon  Anesthesia Plan Comments: (Plan routine monitors, GETA)        Anesthesia Quick Evaluation

## 2014-08-09 NOTE — Anesthesia Postprocedure Evaluation (Signed)
  Anesthesia Post-op Note  Patient: Kristin [name]  Procedure(s) Performed: Procedure(s): ENDOSCOPIC MAXILLARY ANTROSTOMY (Left) ENDOSCOPIC SINUS SURGERY (Left)  Patient Location: PACU  Anesthesia Type:General  Level of Consciousness: awake, alert , oriented and patient cooperative  Airway and Oxygen Therapy: Patient Spontanous Breathing  Post-op Pain: none  Post-op Assessment: Post-op Vital signs reviewed, Patient's Cardiovascular Status Stable, Respiratory Function Stable, Patent Airway and Pain level controlled, nausea improving              Post-op Vital Signs: Reviewed and stable  Last Vitals:  Filed Vitals:   08/09/14 1315  BP: 116/64  Pulse: 63  Temp:   Resp: 15    Complications: No apparent anesthesia complications

## 2014-08-09 NOTE — Transfer of Care (Signed)
Immediate Anesthesia Transfer of Care Note  Patient: Kristin [name]  Procedure(s) Performed: Procedure(s): ENDOSCOPIC MAXILLARY ANTROSTOMY (Left) ENDOSCOPIC SINUS SURGERY (Left)  Patient Location: PACU  Anesthesia Type:General  Level of Consciousness: awake, sedated and patient cooperative  Airway & Oxygen Therapy: Patient Spontanous Breathing and aerosol face mask  Post-op Assessment: Report given to RN and Post -op Vital signs reviewed and stable  Post vital signs: Reviewed and stable  Last Vitals:  Filed Vitals:   08/09/14 1006  BP: 144/69  Pulse: 72  Temp: 36.7 C  Resp: 18    Complications: No apparent anesthesia complications

## 2014-08-09 NOTE — H&P (Signed)
Cc: Recurrent dizziness, chronic sinusitis  HPI: The patient is a 64 year old female who presents today complaining of recurrent dizziness and chronic sinusitis.  The patient is seen in consultation requested by Dr. Sharilyn Sites.  According to the patient, she has been experiencing occasional dizziness for the past 2 to 3 years.  She describes her dizziness as a spinning sensation that lasts for several minutes.  It is often accompanied by a lightheadedness sensation.  She was treated with meclizine in the past.  The patient denies any significant otalgia, aural pressure, otorrhea, tinnitus or change in her hearing.  The frequency of her dizziness has increased over the past few months. She has been experiencing 2-3 episodes a week recently.  The patient has no previous history of ENT surgery.  She underwent a MRI scan 2 weeks ago. The MRI showed no intracranial abnormality.  However, she was noted to have near complete opacification of her left maxillary sinus.  Further review of her CT scan in 2014 showed significant opacification of her left maxillary sinus with hyperdense heterogeneous material within the left maxillary sinus. The findings are concerning for chronic fungal sinusitis.  Currently the patient reports slight pressure sensation over her left midface.  She denies any significant fever or visual change. She has no previous history of coronary artery disease, hypertension, or diabetes.   The patient's review of systems (constitutional, eyes, ENT, cardiovascular, respiratory, GI, musculoskeletal, skin, neurologic, psychiatric, endocrine, hematologic, allergic) is noted in the ROS questionnaire.  It is reviewed with the patient.  Family health history: None.   Major events: Lumbar spine surgery X 2, lumpectomy, carpal tunnel.   Ongoing medical problems: None.   Social history: The patient is married. She denies the use of tobacco, alcohol or illegal drugs.   Exam General: Communicates  without difficulty, well nourished, no acute distress. Head: Normocephalic, no evidence injury, no tenderness, facial buttresses intact without stepoff. Eyes: PERRL, EOMI. No scleral icterus, conjunctivae clear. Neuro: CN II exam reveals vision grossly intact.  No nystagmus at any point of gaze. Ears: Auricles well formed without lesions.  Ear canals are intact without mass or lesion.  No erythema or edema is appreciated.  The TMs are intact without fluid. Nose: External evaluation reveals normal support and skin without lesions.  Dorsum is intact.  Anterior rhinoscopy reveals congested and edematous mucosa over anterior aspect of the inferior turbinates and nasal septum.  No purulence is noted. The middle meatus could not be visualized. Oral:  Oral cavity and oropharynx are intact, symmetric, without erythema or edema.  Mucosa is moist without lesions. Neck: Full range of motion without pain.  There is no significant lymphadenopathy.  No masses palpable.  Thyroid bed within normal limits to palpation.  Parotid glands and submandibular glands equal bilaterally without mass.  Trachea is midline. Neuro:  CN 2-12 grossly intact. Gait normal.   Procedure:  Flexible Nasal Endoscopy: Risks, benefits, and alternatives of flexible endoscopy were explained to the patient.  Specific mention was made of the risk of throat numbness with difficulty swallowing, possible bleeding from the nose and mouth, and pain from the procedure.  The patient gave oral consent to proceed.  The nasal cavities were decongested and anesthetised with a combination of oxymetazoline and 4% lidocaine solution.  The flexible scope was inserted into the right nasal cavity.  Endoscopy of the inferior and middle meatus was performed.  The edematous mucosa was as described above.  No polyp, mass, or lesion was appreciated.  Olfactory cleft was clear.  Nasopharynx was clear.  Turbinates were hypertrophied but without mass.  Incomplete response to  decongestion.  The procedure was repeated on the contralateral side with similar findings.  The patient tolerated the procedure well.  Instructions were given to avoid eating or drinking for 2 hours.   AUDIOMETRIC TESTING:  Shows normal hearing bilaterally across all frequencies. The speech reception threshold is 20dB AD and 15dB AS. The discrimination score is 100% AD and 100% AS. The tympanogram shows mild negative middle ear pressure bilaterally.  Assessment 1.  Recurrent dizziness of unknown etiology.  The patient has a normal otologic and neuro-otologic examination today. 2.  Chronic left maxillary sinusitis.  The CT and MRI images are concerning for chronic fungal sinusitis.  Significant nasal mucosal congestion is noted on today's nasal endoscopy examination.  No purulent drainage is noted today.   3.  Normal hearing bilaterally across all frequencies.   4.  Mild negative middle ear pressure is noted bilaterally.    Plan 1.  The physical exam findings and nasal endoscopy findings are reviewed with the patient.  The hearing test results are also reviewed.   2.  The MRI and CT images are also reviewed with the patient.  3.  It is unclear if her chronic sinusitis is the cause of her chronic dizziness. Based on the chronic nature of her left chronic maxillary sinusitis, she will likely benefit from undergoing left maxillary antrostomy and removal of the inspissated material within the left maxillary sinus.   4.  The patient is also encouraged to perform the Cawthorne-Cooksey exercise at home.  5.  The patient would like to proceed with the left maxillary endoscopic sinus procedure.  6.  We will schedule the procedure in accordance with the patient's schedule.

## 2014-08-09 NOTE — Anesthesia Procedure Notes (Signed)
Procedure Name: Intubation Date/Time: 08/09/2014 11:14 AM Performed by: Lieutenant Diego Pre-anesthesia Checklist: Patient identified, Emergency Drugs available, Suction available and Patient being monitored Patient Re-evaluated:Patient Re-evaluated prior to inductionOxygen Delivery Method: Circle System Utilized Preoxygenation: Pre-oxygenation with 100% oxygen Intubation Type: IV induction Ventilation: Mask ventilation without difficulty Laryngoscope Size: Miller and 2 Grade View: Grade I Tube type: Oral Tube size: 7.0 mm Number of attempts: 1 Airway Equipment and Method: Stylet and Oral airway Placement Confirmation: ETT inserted through vocal cords under direct vision,  positive ETCO2 and breath sounds checked- equal and bilateral Secured at: 21 cm Tube secured with: Tape Dental Injury: Teeth and Oropharynx as per pre-operative assessment

## 2014-08-09 NOTE — Discharge Instructions (Addendum)

## 2014-08-09 NOTE — Op Note (Signed)
DATE OF PROCEDURE:  08/09/2014                              OPERATIVE REPORT  SURGEON:  Leta Baptist, MD  PREOPERATIVE DIAGNOSES: 1. Chronic left maxillary sinusitis.  POSTOPERATIVE DIAGNOSES: 1. Chronic left maxillary sinusitis.  PROCEDURE PERFORMED:  Left endoscopic maxillary antrostomy with tissue removal  ANESTHESIA:  General endotracheal tube anesthesia.  COMPLICATIONS:  None.  ESTIMATED BLOOD LOSS:  Less than 20 ml.  INDICATION FOR PROCEDURE:  Kristin [name] is a 64 y.o. female with a history of chronic left maxillary sinusitis. On her MRI, she was noted to have near complete opacification of her left maxillary sinus.  Further review of her CT scan in 2014 showed significant opacification of her left maxillary sinus with hyperdense heterogeneous material within the left maxillary sinus. The findings are concerning for chronic fungal sinusitis. Based on the above findings, the decision was made for the patient to undergo endoscopic left maxillary antrostomy with removal of the likely fungal elements.The risks, benefits, alternatives, and details of the procedure were discussed with the mother.  Questions were invited and answered.  Informed consent was obtained.  DESCRIPTION:  The patient was taken to the operating room and placed supine on the operating table.  General endotracheal tube anesthesia was administered by the anesthesiologist.  The patient was positioned and prepped and draped in a standard fashion for sinonasal surgery. Pledgets soaked with Afrin were placed in the left nasal cavity for vasoconstriction. The pledgets were subsequently removed. The left nasal cavity was examined with the 0 endoscope. 1% lidocaine with 1-100,000 epinephrine was injected onto the left lateral nasal wall. A standard uncinectomy procedure was performed. The maxillary antrum was enlarged with a combination of backbiting forceps, microdebrider, and Tru-Cut forceps. A large amount of hardened debris was  noted within the left maxillary sinus. The left maxillary content was then removed using a combination of suction catheters and angled forceps. The left maxillary sinus was copiously irrigated. The maxillary antrum was significantly enlarged. Hemostasis was achieved using NASOPORE packing.   The care of the patient was turned over to the anesthesiologist.  The patient was awakened from anesthesia without difficulty.  She was extubated and transferred to the recovery room in good condition.  OPERATIVE FINDINGS:  Chronic left maxillary fungal sinusitis   SPECIMEN:  None.  FOLLOWUP CARE:  The patient will be discharged home once awake and alert.  She will be placed on clindamycin 300mg  po TID for 5 days, and Diflucan daily for 7 days.  Percocet will be used for pain control.  The patient will follow up in my office in approximately 1 week.  Ascencion Dike 08/09/2014 12:06 PM

## 2014-08-10 ENCOUNTER — Encounter (HOSPITAL_BASED_OUTPATIENT_CLINIC_OR_DEPARTMENT_OTHER): Payer: Self-pay | Admitting: Otolaryngology

## 2014-08-30 ENCOUNTER — Ambulatory Visit: Payer: Self-pay

## 2014-08-30 ENCOUNTER — Ambulatory Visit: Payer: 59

## 2014-08-30 ENCOUNTER — Other Ambulatory Visit: Payer: Self-pay

## 2015-02-25 ENCOUNTER — Other Ambulatory Visit (HOSPITAL_COMMUNITY): Payer: Self-pay | Admitting: Family Medicine

## 2015-02-25 DIAGNOSIS — J984 Other disorders of lung: Secondary | ICD-10-CM

## 2015-03-01 ENCOUNTER — Ambulatory Visit (HOSPITAL_COMMUNITY)
Admission: RE | Admit: 2015-03-01 | Discharge: 2015-03-01 | Disposition: A | Discharge status: Home / Self Care | Payer: 59 | Source: Ambulatory Visit | Attending: Family Medicine | Admitting: Family Medicine

## 2015-03-01 DIAGNOSIS — J984 Other disorders of lung: Secondary | ICD-10-CM | POA: Diagnosis present

## 2015-03-01 MED ORDER — IOHEXOL 300 MG/ML  SOLN
75.0000 mL | Freq: Once | INTRAMUSCULAR | Status: AC | PRN
Start: 2015-03-01 — End: 2015-03-01
  Administered 2015-03-01: 75 mL via INTRAVENOUS

## 2015-03-03 ENCOUNTER — Other Ambulatory Visit (HOSPITAL_COMMUNITY): Payer: 59

## 2016-03-13 ENCOUNTER — Ambulatory Visit (HOSPITAL_COMMUNITY)
Admission: RE | Admit: 2016-03-13 | Discharge: 2016-03-13 | Disposition: A | Discharge status: Home / Self Care | Payer: Commercial Managed Care - HMO | Source: Ambulatory Visit | Attending: Family Medicine | Admitting: Family Medicine

## 2016-03-13 ENCOUNTER — Other Ambulatory Visit (HOSPITAL_COMMUNITY): Payer: Self-pay | Admitting: Family Medicine

## 2016-03-13 DIAGNOSIS — M25521 Pain in right elbow: Secondary | ICD-10-CM | POA: Insufficient documentation

## 2016-04-09 ENCOUNTER — Ambulatory Visit (INDEPENDENT_AMBULATORY_CARE_PROVIDER_SITE_OTHER): Payer: Commercial Managed Care - HMO | Admitting: Otolaryngology

## 2016-09-30 ENCOUNTER — Emergency Department (HOSPITAL_COMMUNITY): Payer: 59

## 2016-09-30 ENCOUNTER — Observation Stay (HOSPITAL_COMMUNITY)
Admission: EM | Admit: 2016-09-30 | Discharge: 2016-10-01 | Disposition: A | Discharge status: Home / Self Care | Payer: 59 | Attending: Family Medicine | Admitting: Family Medicine

## 2016-09-30 ENCOUNTER — Encounter (HOSPITAL_COMMUNITY): Payer: Self-pay | Admitting: Emergency Medicine

## 2016-09-30 DIAGNOSIS — M51369 Other intervertebral disc degeneration, lumbar region without mention of lumbar back pain or lower extremity pain: Secondary | ICD-10-CM

## 2016-09-30 DIAGNOSIS — I2510 Atherosclerotic heart disease of native coronary artery without angina pectoris: Secondary | ICD-10-CM | POA: Diagnosis not present

## 2016-09-30 DIAGNOSIS — R110 Nausea: Secondary | ICD-10-CM | POA: Insufficient documentation

## 2016-09-30 DIAGNOSIS — R1032 Left lower quadrant pain: Secondary | ICD-10-CM

## 2016-09-30 DIAGNOSIS — Z79899 Other long term (current) drug therapy: Secondary | ICD-10-CM | POA: Insufficient documentation

## 2016-09-30 DIAGNOSIS — R1012 Left upper quadrant pain: Principal | ICD-10-CM | POA: Insufficient documentation

## 2016-09-30 DIAGNOSIS — M5136 Other intervertebral disc degeneration, lumbar region: Secondary | ICD-10-CM

## 2016-09-30 DIAGNOSIS — N200 Calculus of kidney: Secondary | ICD-10-CM

## 2016-09-30 DIAGNOSIS — Z87891 Personal history of nicotine dependence: Secondary | ICD-10-CM | POA: Diagnosis not present

## 2016-09-30 DIAGNOSIS — E039 Hypothyroidism, unspecified: Secondary | ICD-10-CM | POA: Insufficient documentation

## 2016-09-30 DIAGNOSIS — I700 Atherosclerosis of aorta: Secondary | ICD-10-CM

## 2016-09-30 DIAGNOSIS — R109 Unspecified abdominal pain: Secondary | ICD-10-CM

## 2016-09-30 HISTORY — DX: Disorder of kidney and ureter, unspecified: N28.9

## 2016-09-30 HISTORY — DX: Atherosclerosis of aorta: I70.0

## 2016-09-30 HISTORY — DX: Atherosclerotic heart disease of native coronary artery without angina pectoris: I25.10

## 2016-09-30 HISTORY — DX: Other intervertebral disc degeneration, lumbar region without mention of lumbar back pain or lower extremity pain: M51.369

## 2016-09-30 HISTORY — DX: Other intervertebral disc degeneration, lumbar region: M51.36

## 2016-09-30 LAB — BASIC METABOLIC PANEL
Anion gap: 11 (ref 5–15)
BUN: 26 mg/dL — ABNORMAL HIGH (ref 6–20)
CALCIUM: 9.8 mg/dL (ref 8.9–10.3)
CHLORIDE: 104 mmol/L (ref 101–111)
CO2: 24 mmol/L (ref 22–32)
Creatinine, Ser: 0.73 mg/dL (ref 0.44–1.00)
GFR calc non Af Amer: >60 mL/min (ref >60)
Glucose, Bld: 109 mg/dL — ABNORMAL HIGH (ref 65–99)
Potassium: 3.8 mmol/L (ref 3.5–5.1)
Sodium: 139 mmol/L (ref 135–145)

## 2016-09-30 LAB — CBC
HCT: 43.6 % (ref 36.0–46.0)
HEMOGLOBIN: 15.1 g/dL — AB (ref 12.0–15.0)
MCH: 31.1 pg (ref 26.0–34.0)
MCHC: 34.6 g/dL (ref 30.0–36.0)
MCV: 89.7 fL (ref 78.0–100.0)
Platelets: 305 10*3/uL (ref 150–400)
RBC: 4.86 MIL/uL (ref 3.87–5.11)
RDW: 12.4 % (ref 11.5–15.5)
WBC: 8.7 10*3/uL (ref 4.0–10.5)

## 2016-09-30 LAB — URINALYSIS, ROUTINE W REFLEX MICROSCOPIC
Bilirubin Urine: NEGATIVE
Glucose, UA: NEGATIVE mg/dL
HGB URINE DIPSTICK: NEGATIVE
Ketones, ur: 20 mg/dL — AB
Leukocytes, UA: NEGATIVE
Nitrite: NEGATIVE
Protein, ur: NEGATIVE mg/dL
Specific Gravity, Urine: 1.025 (ref 1.005–1.030)
pH: 6 (ref 5.0–8.0)

## 2016-09-30 LAB — HEPATIC FUNCTION PANEL
ALK PHOS: 79 U/L (ref 38–126)
ALT: 17 U/L (ref 14–54)
AST: 31 U/L (ref 15–41)
Albumin: 4.7 g/dL (ref 3.5–5.0)
BILIRUBIN INDIRECT: 0.9 mg/dL (ref 0.3–0.9)
Bilirubin, Direct: 0.1 mg/dL (ref 0.1–0.5)
TOTAL PROTEIN: 7.6 g/dL (ref 6.5–8.1)
Total Bilirubin: 1 mg/dL (ref 0.3–1.2)

## 2016-09-30 LAB — LIPASE, BLOOD: Lipase: 36 U/L (ref 11–51)

## 2016-09-30 MED ORDER — SODIUM CHLORIDE 0.9% FLUSH
3.0000 mL | Freq: Two times a day (BID) | INTRAVENOUS | Status: DC
  Administered 2016-09-30 (×2): 3 mL via INTRAVENOUS

## 2016-09-30 MED ORDER — HYDROCODONE-ACETAMINOPHEN 5-325 MG PO TABS: 1.0000 | ORAL_TABLET | ORAL | Status: DC | PRN

## 2016-09-30 MED ORDER — HYDROMORPHONE HCL 1 MG/ML IJ SOLN
1.0000 mg | Freq: Once | INTRAMUSCULAR | Status: AC
  Administered 2016-09-30: 1 mg via INTRAVENOUS
  Filled 2016-09-30: qty 1

## 2016-09-30 MED ORDER — ONDANSETRON HCL 4 MG PO TABS: 4.0000 mg | ORAL_TABLET | Freq: Four times a day (QID) | ORAL | Status: DC | PRN

## 2016-09-30 MED ORDER — KETOROLAC TROMETHAMINE 30 MG/ML IJ SOLN
15.0000 mg | Freq: Once | INTRAMUSCULAR | Status: AC
  Administered 2016-09-30: 15 mg via INTRAVENOUS
  Filled 2016-09-30: qty 1

## 2016-09-30 MED ORDER — ONDANSETRON HCL 4 MG/2ML IJ SOLN: 4.0000 mg | Freq: Four times a day (QID) | INTRAMUSCULAR | Status: DC | PRN

## 2016-09-30 MED ORDER — ONDANSETRON HCL 4 MG/2ML IJ SOLN
4.0000 mg | Freq: Once | INTRAMUSCULAR | Status: AC
  Administered 2016-09-30: 4 mg via INTRAVENOUS
  Filled 2016-09-30: qty 2

## 2016-09-30 MED ORDER — ACETAMINOPHEN 325 MG PO TABS
650.0000 mg | ORAL_TABLET | Freq: Four times a day (QID) | ORAL | Status: DC | PRN
  Administered 2016-09-30: 650 mg via ORAL
  Filled 2016-09-30: qty 2

## 2016-09-30 MED ORDER — MECLIZINE HCL 12.5 MG PO TABS: 25.0000 mg | ORAL_TABLET | Freq: Three times a day (TID) | ORAL | Status: DC | PRN

## 2016-09-30 MED ORDER — SODIUM CHLORIDE 0.9 % IV SOLN: 250.0000 mL | INTRAVENOUS | Status: DC | PRN

## 2016-09-30 MED ORDER — ROSUVASTATIN CALCIUM 10 MG PO TABS
10.0000 mg | ORAL_TABLET | Freq: Every day | ORAL | Status: DC
  Filled 2016-09-30: qty 1

## 2016-09-30 MED ORDER — ACETAMINOPHEN 650 MG RE SUPP: 650.0000 mg | Freq: Four times a day (QID) | RECTAL | Status: DC | PRN

## 2016-09-30 MED ORDER — SODIUM CHLORIDE 0.9 % IV SOLN
INTRAVENOUS | Status: DC
  Administered 2016-09-30: 08:00:00 via INTRAVENOUS

## 2016-09-30 MED ORDER — SODIUM CHLORIDE 0.9% FLUSH: 3.0000 mL | INTRAVENOUS | Status: DC | PRN

## 2016-09-30 MED ORDER — LEVOTHYROXINE SODIUM 75 MCG PO TABS
75.0000 ug | ORAL_TABLET | Freq: Every day | ORAL | Status: DC
  Administered 2016-10-01: 75 ug via ORAL
  Filled 2016-09-30: qty 1

## 2016-09-30 MED ORDER — CYCLOSPORINE 0.05 % OP EMUL
1.0000 [drp] | Freq: Two times a day (BID) | OPHTHALMIC | Status: DC
  Administered 2016-09-30 – 2016-10-01 (×2): 1 [drp] via OPHTHALMIC
  Filled 2016-09-30 (×4): qty 1

## 2016-09-30 MED ORDER — PROMETHAZINE HCL 25 MG/ML IJ SOLN
12.5000 mg | Freq: Once | INTRAMUSCULAR | Status: AC
  Administered 2016-09-30: 12.5 mg via INTRAVENOUS
  Filled 2016-09-30: qty 1

## 2016-09-30 MED ORDER — ONDANSETRON HCL 4 MG/2ML IJ SOLN
4.0000 mg | Freq: Once | INTRAMUSCULAR | Status: AC
Start: 2016-09-30 — End: 2016-09-30
  Administered 2016-09-30: 4 mg via INTRAVENOUS
  Filled 2016-09-30: qty 2

## 2016-09-30 MED ORDER — KETOROLAC TROMETHAMINE 15 MG/ML IJ SOLN: 15.0000 mg | Freq: Four times a day (QID) | INTRAMUSCULAR | Status: DC | PRN

## 2016-09-30 NOTE — ED Provider Notes (Signed)
[facility] DEPT Provider Note   CSN: [id number] Arrival date & time: 09/30/16  0740     History   Chief Complaint Chief Complaint  Patient presents with  . Flank Pain    HPI Kristin [name] is a 66 y.o. female.  HPI Patient presents to the emergency room with complaints of acute left flank pain.  Patient states over the last month or so she's had some mild intermittent pain in her left upper abdomen and left flank. She's had a history of kidney stones and thought that was likely causing her trouble.  Last night she woke up with severe pain in the left flank area.he thinks it could be a kidney stone but she is not sure. She denies any blood in her urine. She denies any nausea vomiting diarrhea or fevers. She did try taking some Tylenol but that has not helped. Past Medical History:  Diagnosis Date  . Dizziness   . Hypothyroidism   . Osteoporosis   . Renal disorder   . Sleep apnea    mild osa, does not use CPAP  . Thyroid disease     Patient Active Problem List   Diagnosis Date Noted  . Pulmonary nodule 12/06/2011    Past Surgical History:  Procedure Laterality Date  . BACK SURGERY  1997/1999  . CARPAL TUNNEL RELEASE Right   . CYSTECTOMY  1972   brreast  . MAXILLARY ANTROSTOMY Left 08/09/2014   Procedure: ENDOSCOPIC MAXILLARY ANTROSTOMY;  Surgeon: Leta Baptist, MD;  Location: [facility];  Service: ENT;  Laterality: Left;  . NASAL SINUS SURGERY Left 08/09/2014   Procedure: ENDOSCOPIC SINUS SURGERY;  Surgeon: Leta Baptist, MD;  Location: [facility];  Service: ENT;  Laterality: Left;  . TUBAL LIGATION  1980    OB History    Gravida Para Term Preterm AB Living   1 1 0 0 0 0   SAB TAB Ectopic Multiple Live Births   0 0 0 0         Home Medications    Prior to Admission medications   Medication Sig Start Date End Date Taking? Authorizing Provider  Cholecalciferol (VITAMIN D) 2000 UNITS tablet Take 2,000 Units by mouth every evening.   Yes  [provider]  levothyroxine (SYNTHROID, LEVOTHROID) 50 MCG tablet Take 50 mcg by mouth daily before breakfast.    Yes [provider]  Oyster Shell (OYSTER CALCIUM) 500 MG TABS tablet Take 500 mg of elemental calcium by mouth every evening.   Yes [provider]  risedronate (ACTONEL) 150 MG tablet Take 1 tablet (150 mg total) by mouth every 30 (thirty) days. with water on empty stomach, nothing by mouth or lie down for next 30 minutes. 01/11/12  Yes Rivard, Katharine Look, MD  rosuvastatin (CRESTOR) 10 MG tablet Take 1 tablet by mouth daily. 09/20/16  Yes [provider]  clindamycin (CLEOCIN) 300 MG capsule Take 1 capsule (300 mg total) by mouth 3 (three) times daily. Patient not taking: Reported on 09/30/2016 08/09/14   Leta Baptist, MD  cycloSPORINE (RESTASIS) 0.05 % ophthalmic emulsion Place 1 drop into both eyes 2 (two) times daily.    [provider]  fluconazole (DIFLUCAN) 150 MG tablet Take 1 tablet (150 mg total) by mouth daily. Patient not taking: Reported on 09/30/2016 08/09/14   Leta Baptist, MD  meclizine (ANTIVERT) 25 MG tablet Take 25 mg by mouth 3 (three) times daily as needed for dizziness.    [provider]  oxyCODONE-acetaminophen (ROXICET) 5-325 MG per tablet Take 1 tablet by mouth every 4 (four) hours as needed for severe pain. Patient not taking: Reported on 09/30/2016 08/09/14   Leta Baptist, MD    Family History Family History  Problem Relation Age of Onset  . Colon cancer Father   . Stroke Paternal Grandfather   . Cirrhosis Maternal Grandmother        liver  . Hypertension Mother     Social History Social History  Substance Use Topics  . Smoking status: Former Smoker    Years: 3.00    Types: Cigarettes    Quit date: 01/30/1971  . Smokeless tobacco: Never Used  . Alcohol use No     Allergies   Bee venom and Penicillins   Review of Systems Review of Systems  Respiratory: Negative for shortness of breath.   Cardiovascular:  Negative for chest pain.  Gastrointestinal: Negative for abdominal distention.  All other systems reviewed and are negative.    Physical Exam Updated Vital Signs BP 138/64   Pulse (!) 59   Temp 98.2 F (36.8 C) (Oral)   Resp 18   Ht 1.549 m (5\' 1" )   Wt 54.4 kg (120 lb)   SpO2 96%   BMI 22.67 kg/m   Physical Exam  Constitutional: She appears distressed.  In pain  HENT:  Head: Normocephalic and atraumatic.  Right Ear: External ear normal.  Left Ear: External ear normal.  Eyes: Conjunctivae are normal. Right eye exhibits no discharge. Left eye exhibits no discharge. No scleral icterus.  Neck: Neck supple. No tracheal deviation present.  Cardiovascular: Normal rate, regular rhythm and intact distal pulses.   Pulmonary/Chest: Effort normal and breath sounds normal. No stridor. No respiratory distress. She has no wheezes. She has no rales.  Abdominal: Soft. Bowel sounds are normal. She exhibits no distension, no pulsatile midline mass and no mass. There is no tenderness. There is CVA tenderness (left). There is no rigidity, no rebound and no guarding. No hernia.  Musculoskeletal: She exhibits no edema or tenderness.  Neurological: She is alert. She has normal strength. No cranial nerve deficit (no facial droop, extraocular movements intact, no slurred speech) or sensory deficit. She exhibits normal muscle tone. She displays no seizure activity. Coordination normal.  Skin: Skin is warm and dry. No rash noted. She is not diaphoretic.  Psychiatric: She has a normal mood and affect.  Nursing note and vitals reviewed.    ED Treatments / Results  Labs (all labs ordered are listed, but only abnormal results are displayed) Labs Reviewed  BASIC METABOLIC PANEL - Abnormal; Notable for the following:       Result Value   Glucose, Bld 109 (*)    BUN 26 (*)    All other components within normal limits  CBC - Abnormal; Notable for the following:    Hemoglobin 15.1 (*)    All other  components within normal limits  URINALYSIS, ROUTINE W REFLEX MICROSCOPIC - Abnormal; Notable for the following:    APPearance HAZY (*)    Ketones, ur 20 (*)    All other components within normal limits  HEPATIC FUNCTION PANEL  LIPASE, BLOOD    EKG  EKG Interpretation None       Radiology Ct Renal Stone Study  Result Date: 09/30/2016 CLINICAL DATA:  Stone STudy Standard/Full Left flank pain since 11 pm last night. No blood in urine. EXAM: CT ABDOMEN AND PELVIS WITHOUT CONTRAST TECHNIQUE: Multidetector CT imaging of the abdomen and  pelvis was performed following the standard protocol without IV contrast. COMPARISON:  CT chest 03/01/2015, PET-CT 12/20/2011 and previous FINDINGS: Lower chest: Coronary calcifications. No pleural or pericardial effusion. Hepatobiliary: No focal liver abnormality is seen. No gallstones, gallbladder wall thickening, or biliary dilatation. Pancreas: 1.9 cm low-attenuation lesion in the region of the uncinate process, stable 04/05/2009. Otherwise diffuse mild parenchymal atrophy. No ductal dilatation. Spleen: Normal in size without focal abnormality. Adrenals/Urinary Tract: Normal adrenal glands. Bilateral nephrolithiasis, largest calculus 5 mm, left lower pole. No hydronephrosis. Stable 6 mm hyperdense lesion, mid right kidney. Ureters decompressed. Urinary bladder incompletely distended. Stomach/Bowel: Stomach is within normal limits. Appendix appears normal. No evidence of bowel wall thickening, distention, or inflammatory changes. Vascular/Lymphatic: Scattered aortoiliac atheromatous calcifications. No aneurysm. No abdominal or pelvic adenopathy. Reproductive: Uterus and bilateral adnexa are unremarkable. Other: No ascites.  No free air. Musculoskeletal: Degenerative disc disease L3-4. Negative for fracture or worrisome bone lesion. IMPRESSION: 1. Bilateral nephrolithiasis without hydronephrosis. 2. Degenerative disc disease L3-4 3. Coronary and Aortic Atherosclerosis  (ICD10-170.0). 4. 1.9 cm cystic pancreatic lesion in the uncinate process, presumably benign given stability over greater than 5 years. Electronically Signed   By: Lucrezia Europe M.D.   On: 09/30/2016 10:03    Procedures Procedures (including critical care time)  Medications Ordered in ED Medications  0.9 %  sodium chloride infusion ( Intravenous Stopped 09/30/16 1052)  promethazine (PHENERGAN) injection 12.5 mg (not administered)  HYDROmorphone (DILAUDID) injection 1 mg (1 mg Intravenous Given 09/30/16 0810)  ondansetron (ZOFRAN) injection 4 mg (4 mg Intravenous Given 09/30/16 0809)  HYDROmorphone (DILAUDID) injection 1 mg (1 mg Intravenous Given 09/30/16 0903)  ketorolac (TORADOL) 30 MG/ML injection 15 mg (15 mg Intravenous Given 09/30/16 1052)  ondansetron (ZOFRAN) injection 4 mg (4 mg Intravenous Given 09/30/16 1137)     Initial Impression / Assessment and Plan / ED Course  I have reviewed the triage vital signs and the nursing notes.  Pertinent labs & imaging results that were available during my care of the patient were reviewed by me and considered in my medical decision making (see chart for details).  Clinical Course as of Sep 30 1240  Sun Sep 30, 2016  0854 Still having severe pain.  Will give another dose of pain meds.  CT to evaluate further  [JK]  1233 Pt still nauseated.  Continues to feel very poorly  [JK]    Clinical Course User Index [JK] Dorie Rank, MD    Patient presented to the emergency room with complaints of back pain.  She has a history of kidney stones. Initially her symptoms were felt to most likely related to recurrent renal colic. CT scan however does not show any ureteral stones and no signs of hydronephrosis. There is no other acute pathology to account for her symptoms.   Patient's symptoms persist. She is now having severe nausea and dry heaving.: I have ordered an EKG to make sure not having any occult cardiac issues.  Consult with medical service for admission for  observation, symptomatic treatment.   Final Clinical Impressions(s) / ED Diagnoses   Final diagnoses:  Abdominal pain, unspecified abdominal location  Nausea    New Prescriptions New Prescriptions   No medications on file     Dorie Rank, MD 10/03/16 1110

## 2016-09-30 NOTE — ED Triage Notes (Signed)
Patient c/o flank pain that started last night, waking her from her sleep. Hx of kidney stones. Denies any pain or blood with urination. Denies any nausea, vomiting, diarrhea, or fevers. Patient reports taking tylenol with last dose an hour ago, no relief noted.

## 2016-09-30 NOTE — ED Notes (Signed)
Pt feeling dizzy and dry heaving. MD notified.

## 2016-09-30 NOTE — H&P (Signed)
History and Physical  Kristin [name] [id number]:[id number] DOB: [birth year] DOA: 09/30/2016  PCP: Redmond School, MD  Patient coming from: home  Chief Complaint: flank pain  HPI:  3yow PMH hypothyroidism, nephrolithiasis presented to the emergency department with severe left flank pain. Workup unrevealing including negative urinalysis, unremarkable CBC, chemistry panel, CT renal study. Because of ongoing pain and nausea, referred for observation.  Patient reports left-sided flank pain has been occurring for approximately one month, intermittent in nature, without specific triggers, lasting usually only a few minutes and sometimes relieved with movement. Although severe, because of it being transient, she has not sought evaluation. Last night at 11 PM the same pain recurred and was more severe. It was persistent throughout the night and so she came for evaluation today. Described as being quite severe, sudden grabbing type pain which was eventually relieved with analgesics in the emergency department. Located in the left flank, radiating around to the left lower quadrant. No nausea or vomiting. No dysuria or bleeding noted. No chest pain or shortness of breath. No abdominal pain other than from radiation. No back problems although she has had low back surgery in the past. 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  ED Course: Afebrile, vital signs stable. Treated with hydromorphone, ketorolac, Zofran, Phenergan, IV fluids.   Review of Systems:  Negative for fever, weight loss, chills, visual changes, sore throat, rash, chest pain, shortness of breath, dysuria, bleeding, vomiting, abdominal pain.   Past Medical History:  Diagnosis Date  . Dizziness   . Hypothyroidism   . Osteoporosis   . Renal disorder   . Sleep apnea    mild osa, does not use CPAP  . Thyroid disease     Past Surgical History:  Procedure Laterality Date  . BACK SURGERY  1997/1999  . CARPAL  TUNNEL RELEASE Right   . CYSTECTOMY  1972   brreast  . MAXILLARY ANTROSTOMY Left 08/09/2014   Procedure: ENDOSCOPIC MAXILLARY ANTROSTOMY;  Surgeon: Leta Baptist, MD;  Location: [facility];  Service: ENT;  Laterality: Left;  . NASAL SINUS SURGERY Left 08/09/2014   Procedure: ENDOSCOPIC SINUS SURGERY;  Surgeon: Leta Baptist, MD;  Location: [facility];  Service: ENT;  Laterality: Left;  . TUBAL LIGATION  1980     reports that she quit smoking about 45 years ago. Her smoking use included Cigarettes. She quit after 3.00 years of use. She has never used smokeless tobacco. She reports that she does not drink alcohol or use drugs. Mobility: Ambulatory  Allergies  Allergen Reactions  . Bee Venom Anaphylaxis  . Penicillins Swelling    Has patient had a PCN reaction causing immediate rash, facial/tongue/throat swelling, SOB or lightheadedness with hypotension: No Has patient had a PCN reaction causing severe rash involving mucus membranes or skin necrosis: No Has patient had a PCN reaction that required hospitalization: No Has patient had a PCN reaction occurring within the last 10 years: No If all of the above answers are "NO", then may proceed with Cephalosporin use.      Family History  Problem Relation Age of Onset  . Colon cancer Father   . Stroke Paternal Grandfather   . Cirrhosis Maternal Grandmother        liver  . Hypertension Mother      Prior to Admission medications   Medication Sig Start Date End Date Taking? Authorizing Provider  Cholecalciferol (VITAMIN D) 2000 UNITS tablet Take 2,000 Units  by mouth every evening.   Yes [provider]  levothyroxine (SYNTHROID, LEVOTHROID) 50 MCG tablet Take 50 mcg by mouth daily before breakfast.    Yes [provider]  Oyster Shell (OYSTER CALCIUM) 500 MG TABS tablet Take 500 mg of elemental calcium by mouth every evening.   Yes [provider]  risedronate (ACTONEL) 150 MG tablet Take 1  tablet (150 mg total) by mouth every 30 (thirty) days. with water on empty stomach, nothing by mouth or lie down for next 30 minutes. 01/11/12  Yes Rivard, Katharine Look, MD  rosuvastatin (CRESTOR) 10 MG tablet Take 1 tablet by mouth daily. 09/20/16  Yes [provider]  clindamycin (CLEOCIN) 300 MG capsule Take 1 capsule (300 mg total) by mouth 3 (three) times daily. Patient not taking: Reported on 09/30/2016 08/09/14   Leta Baptist, MD  cycloSPORINE (RESTASIS) 0.05 % ophthalmic emulsion Place 1 drop into both eyes 2 (two) times daily.    [provider]  fluconazole (DIFLUCAN) 150 MG tablet Take 1 tablet (150 mg total) by mouth daily. Patient not taking: Reported on 09/30/2016 08/09/14   Leta Baptist, MD  meclizine (ANTIVERT) 25 MG tablet Take 25 mg by mouth 3 (three) times daily as needed for dizziness.    [provider]  oxyCODONE-acetaminophen (ROXICET) 5-325 MG per tablet Take 1 tablet by mouth every 4 (four) hours as needed for severe pain. Patient not taking: Reported on 09/30/2016 08/09/14   Leta Baptist, MD    Physical Exam:   Constitutional. 98.2, 12, 50, 106/65, 100% on room air. Appears calm, comfortable. Currently pain free.  Eyes. Pupils, irises, lids appear unremarkable.  ENT. Grossly normal hearing, lips, tongue.  Neck. No lymphadenopathy or masses. No thyromegaly.  Respiratory. Clear to auscultation bilaterally. No wheezes, rales or rhonchi. Normal respiratory effort.  Cardiovascular. Regular rate and rhythm. No murmur, rub or gallop. No lower extremity edema. Dorsalis pedis pulses 2+ bilaterally.  Abdomen appears unremarkable. Soft, nontender, nondistended. No hernias noted. No left lower quadrant pain with palpation. Perhaps left upper quadrant pain with palpation although not consistent. No costovertebral angle tenderness on the left although this is where she localizes the pain to when it occurs. Skin of the back appears unremarkable and is notable for a vertical  midline lumbar incision consistent with history of back surgery.  Skin. No rash or induration. Nontender to palpation.  Musculoskeletal. Grossly normal tone and strength upper extremities. Digits and nails of the upper extremities appear unremarkable.  Psychiatric. Grossly normal mood and affect. Speech fluent and appropriate.  Wt Readings from Last 3 Encounters:  09/30/16 54.4 kg (120 lb)  08/09/14 57.6 kg (127 lb)  07/08/14 54.4 kg (120 lb)    I have personally reviewed following labs and imaging studies  Labs:   Complete metabolic panel unremarkable. Lipase within normal limits.  CBC unremarkable.  Urinalysis negative.  Imaging studies:   CT renal stone study notable for bilateral nephrolithiasis, largest calculus 5 mm left lower pole. No hydronephrosis. No bowel wall thickening. Stomach and appendix appear unremarkable. No inflammatory changes noted. Degenerative disc disease at L3-4.  Medical tests:   EKG and apparently reviewed. Sinus bradycardia. No acute changes.   Principal Problem:   Left flank pain Active Problems:   LLQ pain   Bilateral nephrolithiasis   Lumbar degenerative disc disease   Aortic atherosclerosis (HCC)   Coronary atherosclerosis   Assessment/Plan Left flank and left lower quadrant abdominal pain of unclear etiology with associated nausea. Afebrile, vitals stable,  no leukocytosis; urinalysis, CMP and lipase unremarkable. CT of the abdomen and pelvis renal stone study with no acute findings. EKG unremarkable. Differential would include nephrolithiasis, muscle strain, lumbar disc disease. There is no evidence to suggest infection. -Given severity of pain and nausea, plan observation, supportive care, analgesics, antiemetics PRN.  Bilateral nephrolithiasis without hydronephrosis. -No hydronephrosis, no hematuria. It's possible left stone could be symptomatic although this would be unusual.  Degenerative disc disease L3-4 -She has no pain with  palpation over the lumbar spine. Her symptoms in regard to location and pattern could be secondary to radicular phenomenon, however the description of her pain makes this less likely.  Coronary and aortic atherosclerosis -Asymptomatic. History does not suggest ischemia. EKG reassuring. Consider statin. Follow-up for further evaluation as an outpatient.   Severity of Illness: The appropriate patient status for this patient is OBSERVATION. Observation status is judged to be reasonable and necessary in order to provide the required intensity of service to ensure the patient's safety. The patient's presenting symptoms, physical exam findings, and initial radiographic and laboratory data in the context of their medical condition is felt to place them at decreased risk for further clinical deterioration. Furthermore, it is anticipated that the patient will be medically stable for discharge from the hospital within 2 midnights of admission. The following factors support the patient status of observation.   " The patient's presenting symptoms include severe left flank, left lower quadrant pain with severe nausea. "    DVT prophylaxis: early ambulation Code Status: full Family Communication: husband at bedside     Time spent: 54 minutes  Murray Hodgkins, MD  [facility] Direct contact: [phone] --[place] [place]  --[email or web address]; password [email or web address]  7PM-7AM contact night coverage as above  09/30/2016, 1:42 PM

## 2016-09-30 NOTE — ED Notes (Signed)
Report given to Morgan RN °

## 2016-09-30 NOTE — ED Notes (Signed)
EKG given to Dr. Knapp. 

## 2016-09-30 NOTE — ED Notes (Signed)
In and out cath performed by Enis Gash, [place] and this nurse.

## 2016-10-01 DIAGNOSIS — R109 Unspecified abdominal pain: Secondary | ICD-10-CM | POA: Diagnosis not present

## 2016-10-01 DIAGNOSIS — R1032 Left lower quadrant pain: Secondary | ICD-10-CM | POA: Diagnosis not present

## 2016-10-01 DIAGNOSIS — N200 Calculus of kidney: Secondary | ICD-10-CM | POA: Diagnosis not present

## 2016-10-01 DIAGNOSIS — I700 Atherosclerosis of aorta: Secondary | ICD-10-CM | POA: Diagnosis not present

## 2016-10-01 LAB — BASIC METABOLIC PANEL
ANION GAP: 7 (ref 5–15)
BUN: 19 mg/dL (ref 6–20)
CHLORIDE: 105 mmol/L (ref 101–111)
CO2: 28 mmol/L (ref 22–32)
CREATININE: 0.75 mg/dL (ref 0.44–1.00)
Calcium: 9 mg/dL (ref 8.9–10.3)
GFR calc non Af Amer: >60 mL/min (ref >60)
Glucose, Bld: 103 mg/dL — ABNORMAL HIGH (ref 65–99)
POTASSIUM: 4 mmol/L (ref 3.5–5.1)
SODIUM: 140 mmol/L (ref 135–145)

## 2016-10-01 LAB — CBC
HCT: 37.6 % (ref 36.0–46.0)
HEMOGLOBIN: 12.5 g/dL (ref 12.0–15.0)
MCH: 30.6 pg (ref 26.0–34.0)
MCHC: 33.2 g/dL (ref 30.0–36.0)
MCV: 91.9 fL (ref 78.0–100.0)
PLATELETS: 253 10*3/uL (ref 150–400)
RBC: 4.09 MIL/uL (ref 3.87–5.11)
RDW: 12.6 % (ref 11.5–15.5)
WBC: 7 10*3/uL (ref 4.0–10.5)

## 2016-10-01 NOTE — Progress Notes (Signed)
IV removed.

## 2016-10-01 NOTE — Discharge Summary (Signed)
Physician Discharge Summary  Kristin [name] ZOX:[id number] DOB: Oct 15, 1950 DOA: 09/30/2016  PCP: Redmond School, MD  Admit date: 09/30/2016 Discharge date: 10/01/2016  Recommendations for Outpatient Follow-up:  1. Left flank pain, see discussion below. 2. Bilateral nephrolithiasis 3. Coronary and aortic atherosclerosis. Could consider statin therapy.   Follow-up Information    Redmond School, MD Follow up.   Specialty:  Internal Medicine Why:  as needed Contact information: [address] [place] [address] [phone]        [place]. Schedule an appointment as soon as possible for a visit in 2 week(s).   Contact information: [place] [phone]           Discharge Diagnoses:  1. Left flank and left lower quadrant abdominal pain 2. Bilateral nephrolithiasis without hydronephrosis 3. Coronary and aortic atherosclerosis  Discharge Condition: Improved Disposition: Home  Diet recommendation: Heart healthy  Filed Weights   09/30/16 0749 09/30/16 1520  Weight: 54.4 kg (120 lb) 56.9 kg (125 lb 8 oz)    History of present illness:  83yow PMH hypothyroidism, nephrolithiasis presented to the emergency department with severe left flank pain. Workup unrevealing including negative urinalysis, unremarkable CBC, chemistry panel, CT renal study. Because of ongoing pain and nausea, referred for observation.  Hospital Course:  Patient was observed overnight. She had no recurrence of significant pain and did not require any further analgesics other than Tylenol. The following morning she felt well. Tolerating diet. No vomiting. Etiology and significance of her pain remains unclear. Given her negative evaluation, there is no evidence to suggest severe illness. She does have nephrolithiasis bilaterally, and I have advised her to follow-up with her neurologist for any further recommendations. Discussed with her  and her husband, they know to return for recurrent or worsening symptoms. Hospitalization was uncomplicated.  Today's assessment: S: No issues overnight. No significant pain. Has not needed pain medication. Tolerating diet. O: Vitals: Afebrile, 98.4, 16, 56, 111/58, 95% on room air   Constitutional. Appears calm, comfortable.  Respiratory. Clear to auscultation bilaterally. No wheezes, rales or rhonchi. Normal respiratory effort.  Cardiovascular. Regular rate and rhythm. No murmur, rub or gallop.  Abdomen. Soft, nontender, nondistended.   Back. There is mild tenderness at the left costovertebral angle.  Psychiatric. Grossly normal mood and affect. Speech fluent and appropriate.  Basic metabolic panel unremarkable. CBC unremarkable.  Discharge Instructions  Discharge Instructions    Activity as tolerated - No restrictions    Complete by:  As directed    Diet - low sodium heart healthy    Complete by:  As directed    Discharge instructions    Complete by:  As directed    Call your physician or seek immediate medical attention for pain, fever, vomiting, weakness or worsening of condition.     Allergies as of 10/01/2016      Reactions   Bee Venom Anaphylaxis   Penicillins Swelling   Has patient had a PCN reaction causing immediate rash, facial/tongue/throat swelling, SOB or lightheadedness with hypotension: No Has patient had a PCN reaction causing severe rash involving mucus membranes or skin necrosis: No Has patient had a PCN reaction that required hospitalization: No Has patient had a PCN reaction occurring within the last 10 years: No If all of the above answers are "NO", then may proceed with Cephalosporin use.      Medication List    TAKE these medications   clindamycin 300 MG capsule Commonly  known as:  CLEOCIN Take 1 capsule (300 mg total) by mouth 3 (three) times daily.   fluconazole 150 MG tablet Commonly known as:  DIFLUCAN Take 1 tablet (150 mg total) by  mouth daily.   levothyroxine 50 MCG tablet Commonly known as:  SYNTHROID, LEVOTHROID Take 50 mcg by mouth daily before breakfast.   meclizine 25 MG tablet Commonly known as:  ANTIVERT Take 25 mg by mouth 3 (three) times daily as needed for dizziness.   oxyCODONE-acetaminophen 5-325 MG tablet Commonly known as:  ROXICET Take 1 tablet by mouth every 4 (four) hours as needed for severe pain.   oyster calcium 500 MG Tabs tablet Take 500 mg of elemental calcium by mouth every evening.   RESTASIS 0.05 % ophthalmic emulsion Generic drug:  cycloSPORINE Place 1 drop into both eyes 2 (two) times daily.   risedronate 150 MG tablet Commonly known as:  ACTONEL Take 1 tablet (150 mg total) by mouth every 30 (thirty) days. with water on empty stomach, nothing by mouth or lie down for next 30 minutes.   rosuvastatin 10 MG tablet Commonly known as:  CRESTOR Take 1 tablet by mouth daily.   Vitamin D 2000 units tablet Take 2,000 Units by mouth every evening.            Discharge Care Instructions        Start     Ordered   10/01/16 0000  Diet - low sodium heart healthy     10/01/16 1054   10/01/16 0000  Discharge instructions    Comments:  Call your physician or seek immediate medical attention for pain, fever, vomiting, weakness or worsening of condition.   10/01/16 1054   10/01/16 0000  Activity as tolerated - No restrictions     10/01/16 1054     Allergies  Allergen Reactions  . Bee Venom Anaphylaxis  . Penicillins Swelling    Has patient had a PCN reaction causing immediate rash, facial/tongue/throat swelling, SOB or lightheadedness with hypotension: No Has patient had a PCN reaction causing severe rash involving mucus membranes or skin necrosis: No Has patient had a PCN reaction that required hospitalization: No Has patient had a PCN reaction occurring within the last 10 years: No If all of the above answers are "NO", then may proceed with Cephalosporin use.      The  results of significant diagnostics from this hospitalization (including imaging, microbiology, ancillary and laboratory) are listed below for reference.    Significant Diagnostic Studies: Ct Renal Stone Study  Result Date: 09/30/2016 CLINICAL DATA:  Joaquim Lai STudy Standard/Full Left flank pain since 11 pm last night. No blood in urine. EXAM: CT ABDOMEN AND PELVIS WITHOUT CONTRAST TECHNIQUE: Multidetector CT imaging of the abdomen and pelvis was performed following the standard protocol without IV contrast. COMPARISON:  CT chest 03/01/2015, PET-CT 12/20/2011 and previous FINDINGS: Lower chest: Coronary calcifications. No pleural or pericardial effusion. Hepatobiliary: No focal liver abnormality is seen. No gallstones, gallbladder wall thickening, or biliary dilatation. Pancreas: 1.9 cm low-attenuation lesion in the region of the uncinate process, stable 04/05/2009. Otherwise diffuse mild parenchymal atrophy. No ductal dilatation. Spleen: Normal in size without focal abnormality. Adrenals/Urinary Tract: Normal adrenal glands. Bilateral nephrolithiasis, largest calculus 5 mm, left lower pole. No hydronephrosis. Stable 6 mm hyperdense lesion, mid right kidney. Ureters decompressed. Urinary bladder incompletely distended. Stomach/Bowel: Stomach is within normal limits. Appendix appears normal. No evidence of bowel wall thickening, distention, or inflammatory changes. Vascular/Lymphatic: Scattered aortoiliac atheromatous calcifications. No aneurysm. No  abdominal or pelvic adenopathy. Reproductive: Uterus and bilateral adnexa are unremarkable. Other: No ascites.  No free air. Musculoskeletal: Degenerative disc disease L3-4. Negative for fracture or worrisome bone lesion. IMPRESSION: 1. Bilateral nephrolithiasis without hydronephrosis. 2. Degenerative disc disease L3-4 3. Coronary and Aortic Atherosclerosis (ICD10-170.0). 4. 1.9 cm cystic pancreatic lesion in the uncinate process, presumably benign given stability over  greater than 5 years. Electronically Signed   By: Lucrezia Europe M.D.   On: 09/30/2016 10:03      Labs: Basic Metabolic Panel:  Recent Labs Lab 09/30/16 0805 10/01/16 0626  NA 139 140  K 3.8 4.0  CL 104 105  CO2 24 28  GLUCOSE 109* 103*  BUN 26* 19  CREATININE 0.73 0.75  CALCIUM 9.8 9.0   Liver Function Tests:  Recent Labs Lab 09/30/16 0805  AST 31  ALT 17  ALKPHOS 79  BILITOT 1.0  PROT 7.6  ALBUMIN 4.7    Recent Labs Lab 09/30/16 0805  LIPASE 36   CBC:  Recent Labs Lab 09/30/16 0805 10/01/16 0626  WBC 8.7 7.0  HGB 15.1* 12.5  HCT 43.6 37.6  MCV 89.7 91.9  PLT 305 253    Principal Problem:   Left flank pain Active Problems:   LLQ pain   Bilateral nephrolithiasis   Lumbar degenerative disc disease   Aortic atherosclerosis (HCC)   Coronary atherosclerosis   Time coordinating discharge: 20 minutes  Signed:  Murray Hodgkins, MD [facility] 10/01/2016, 3:06 PM

## 2016-10-01 NOTE — Progress Notes (Signed)
Patient's IV removed.  Site WNL.  AVS reviewed with patient.  Verbalized understanding of discharge instructions, physician follow-up, medications.  Patient transported by NT via w/c to main entrance at discharge.  Patient stable at time of discharge. 

## 2017-02-08 DIAGNOSIS — E039 Hypothyroidism, unspecified: Secondary | ICD-10-CM | POA: Diagnosis not present

## 2017-03-05 DIAGNOSIS — E039 Hypothyroidism, unspecified: Secondary | ICD-10-CM | POA: Diagnosis not present

## 2017-03-05 DIAGNOSIS — Z6822 Body mass index (BMI) 22.0-22.9, adult: Secondary | ICD-10-CM | POA: Diagnosis not present

## 2017-03-05 DIAGNOSIS — Z1389 Encounter for screening for other disorder: Secondary | ICD-10-CM | POA: Diagnosis not present

## 2017-05-09 DIAGNOSIS — Z23 Encounter for immunization: Secondary | ICD-10-CM | POA: Diagnosis not present

## 2017-05-09 DIAGNOSIS — Z1389 Encounter for screening for other disorder: Secondary | ICD-10-CM | POA: Diagnosis not present

## 2017-05-09 DIAGNOSIS — E039 Hypothyroidism, unspecified: Secondary | ICD-10-CM | POA: Diagnosis not present

## 2017-05-23 DIAGNOSIS — M79672 Pain in left foot: Secondary | ICD-10-CM | POA: Diagnosis not present

## 2017-05-23 DIAGNOSIS — S92322A Displaced fracture of second metatarsal bone, left foot, initial encounter for closed fracture: Secondary | ICD-10-CM | POA: Diagnosis not present

## 2017-05-23 DIAGNOSIS — M79675 Pain in left toe(s): Secondary | ICD-10-CM | POA: Diagnosis not present

## 2017-05-23 DIAGNOSIS — S92335A Nondisplaced fracture of third metatarsal bone, left foot, initial encounter for closed fracture: Secondary | ICD-10-CM | POA: Diagnosis not present

## 2017-05-30 DIAGNOSIS — H40013 Open angle with borderline findings, low risk, bilateral: Secondary | ICD-10-CM | POA: Diagnosis not present

## 2017-05-30 DIAGNOSIS — H25013 Cortical age-related cataract, bilateral: Secondary | ICD-10-CM | POA: Diagnosis not present

## 2017-05-30 DIAGNOSIS — H2513 Age-related nuclear cataract, bilateral: Secondary | ICD-10-CM | POA: Diagnosis not present

## 2017-05-30 DIAGNOSIS — H04123 Dry eye syndrome of bilateral lacrimal glands: Secondary | ICD-10-CM | POA: Diagnosis not present

## 2017-06-25 DIAGNOSIS — E782 Mixed hyperlipidemia: Secondary | ICD-10-CM | POA: Diagnosis not present

## 2017-07-19 DIAGNOSIS — E039 Hypothyroidism, unspecified: Secondary | ICD-10-CM | POA: Diagnosis not present

## 2017-08-30 DIAGNOSIS — E039 Hypothyroidism, unspecified: Secondary | ICD-10-CM | POA: Diagnosis not present

## 2017-09-09 ENCOUNTER — Ambulatory Visit (INDEPENDENT_AMBULATORY_CARE_PROVIDER_SITE_OTHER): Payer: Medicare Other | Admitting: Otolaryngology

## 2017-09-09 DIAGNOSIS — J320 Chronic maxillary sinusitis: Secondary | ICD-10-CM

## 2017-10-02 ENCOUNTER — Other Ambulatory Visit: Payer: Self-pay | Admitting: Otolaryngology

## 2017-10-16 DIAGNOSIS — E039 Hypothyroidism, unspecified: Secondary | ICD-10-CM | POA: Diagnosis not present

## 2017-10-18 DIAGNOSIS — E063 Autoimmune thyroiditis: Secondary | ICD-10-CM | POA: Diagnosis not present

## 2017-10-18 DIAGNOSIS — E038 Other specified hypothyroidism: Secondary | ICD-10-CM | POA: Diagnosis not present

## 2017-10-21 DIAGNOSIS — Z1211 Encounter for screening for malignant neoplasm of colon: Secondary | ICD-10-CM | POA: Diagnosis not present

## 2017-10-21 DIAGNOSIS — Z1231 Encounter for screening mammogram for malignant neoplasm of breast: Secondary | ICD-10-CM | POA: Diagnosis not present

## 2017-10-21 DIAGNOSIS — M810 Age-related osteoporosis without current pathological fracture: Secondary | ICD-10-CM | POA: Diagnosis not present

## 2017-10-21 DIAGNOSIS — Z6823 Body mass index (BMI) 23.0-23.9, adult: Secondary | ICD-10-CM | POA: Diagnosis not present

## 2017-10-22 ENCOUNTER — Encounter (HOSPITAL_BASED_OUTPATIENT_CLINIC_OR_DEPARTMENT_OTHER): Payer: Self-pay | Admitting: *Deleted

## 2017-10-22 ENCOUNTER — Other Ambulatory Visit: Payer: Self-pay

## 2017-10-29 ENCOUNTER — Ambulatory Visit (HOSPITAL_BASED_OUTPATIENT_CLINIC_OR_DEPARTMENT_OTHER): Payer: Medicare Other | Admitting: Certified Registered"

## 2017-10-29 ENCOUNTER — Ambulatory Visit (HOSPITAL_BASED_OUTPATIENT_CLINIC_OR_DEPARTMENT_OTHER)
Admission: RE | Admit: 2017-10-29 | Discharge: 2017-10-29 | Disposition: A | Discharge status: Home / Self Care | Payer: Medicare Other | Source: Ambulatory Visit | Attending: Otolaryngology | Admitting: Otolaryngology

## 2017-10-29 ENCOUNTER — Other Ambulatory Visit: Payer: Self-pay

## 2017-10-29 ENCOUNTER — Encounter (HOSPITAL_BASED_OUTPATIENT_CLINIC_OR_DEPARTMENT_OTHER)
Admission: RE | Disposition: A | Discharge status: Home / Self Care | Payer: Self-pay | Source: Ambulatory Visit | Attending: Otolaryngology

## 2017-10-29 ENCOUNTER — Encounter (HOSPITAL_BASED_OUTPATIENT_CLINIC_OR_DEPARTMENT_OTHER): Payer: Self-pay | Admitting: *Deleted

## 2017-10-29 DIAGNOSIS — I2510 Atherosclerotic heart disease of native coronary artery without angina pectoris: Secondary | ICD-10-CM | POA: Diagnosis not present

## 2017-10-29 DIAGNOSIS — E039 Hypothyroidism, unspecified: Secondary | ICD-10-CM | POA: Insufficient documentation

## 2017-10-29 DIAGNOSIS — J338 Other polyp of sinus: Secondary | ICD-10-CM | POA: Diagnosis not present

## 2017-10-29 DIAGNOSIS — Z87891 Personal history of nicotine dependence: Secondary | ICD-10-CM | POA: Insufficient documentation

## 2017-10-29 DIAGNOSIS — Z79899 Other long term (current) drug therapy: Secondary | ICD-10-CM | POA: Insufficient documentation

## 2017-10-29 DIAGNOSIS — J320 Chronic maxillary sinusitis: Secondary | ICD-10-CM | POA: Diagnosis not present

## 2017-10-29 DIAGNOSIS — J0100 Acute maxillary sinusitis, unspecified: Secondary | ICD-10-CM | POA: Diagnosis not present

## 2017-10-29 DIAGNOSIS — G4730 Sleep apnea, unspecified: Secondary | ICD-10-CM | POA: Diagnosis not present

## 2017-10-29 HISTORY — DX: Other specified postprocedural states: Z98.890

## 2017-10-29 HISTORY — PX: MAXILLARY ANTROSTOMY: SHX2003

## 2017-10-29 HISTORY — DX: Other complications of anesthesia, initial encounter: T88.59XA

## 2017-10-29 HISTORY — DX: Nausea with vomiting, unspecified: R11.2

## 2017-10-29 HISTORY — DX: Chronic maxillary sinusitis: J32.0

## 2017-10-29 HISTORY — DX: Adverse effect of unspecified anesthetic, initial encounter: T41.45XA

## 2017-10-29 SURGERY — MAXILLARY ANTROSTOMY
Anesthesia: General | Site: Nose | Laterality: Left

## 2017-10-29 MED ORDER — PROPOFOL 10 MG/ML IV BOLUS
INTRAVENOUS | Status: DC | PRN
  Administered 2017-10-29: 100 mg via INTRAVENOUS

## 2017-10-29 MED ORDER — PROPOFOL 10 MG/ML IV BOLUS
INTRAVENOUS | Status: AC
  Filled 2017-10-29: qty 20

## 2017-10-29 MED ORDER — DEXAMETHASONE SODIUM PHOSPHATE 4 MG/ML IJ SOLN
INTRAMUSCULAR | Status: DC | PRN
  Administered 2017-10-29: 10 mg via INTRAVENOUS

## 2017-10-29 MED ORDER — SUGAMMADEX SODIUM 500 MG/5ML IV SOLN
INTRAVENOUS | Status: AC
  Filled 2017-10-29: qty 5

## 2017-10-29 MED ORDER — ROCURONIUM BROMIDE 100 MG/10ML IV SOLN
INTRAVENOUS | Status: DC | PRN
  Administered 2017-10-29: 50 mg via INTRAVENOUS

## 2017-10-29 MED ORDER — FENTANYL CITRATE (PF) 100 MCG/2ML IJ SOLN
50.0000 ug | INTRAMUSCULAR | Status: DC | PRN
  Administered 2017-10-29: 100 ug via INTRAVENOUS

## 2017-10-29 MED ORDER — FENTANYL CITRATE (PF) 100 MCG/2ML IJ SOLN
INTRAMUSCULAR | Status: AC
  Filled 2017-10-29: qty 2

## 2017-10-29 MED ORDER — CLINDAMYCIN PHOSPHATE 600 MG/50ML IV SOLN
INTRAVENOUS | Status: DC | PRN
  Administered 2017-10-29: 600 mg via INTRAVENOUS

## 2017-10-29 MED ORDER — EPHEDRINE SULFATE 50 MG/ML IJ SOLN
INTRAMUSCULAR | Status: DC | PRN
  Administered 2017-10-29: 10 mg via INTRAVENOUS

## 2017-10-29 MED ORDER — LIDOCAINE 2% (20 MG/ML) 5 ML SYRINGE
INTRAMUSCULAR | Status: AC
  Filled 2017-10-29: qty 5

## 2017-10-29 MED ORDER — DEXAMETHASONE SODIUM PHOSPHATE 10 MG/ML IJ SOLN
INTRAMUSCULAR | Status: AC
  Filled 2017-10-29: qty 1

## 2017-10-29 MED ORDER — PROPOFOL 500 MG/50ML IV EMUL
INTRAVENOUS | Status: AC
  Filled 2017-10-29: qty 100

## 2017-10-29 MED ORDER — OXYMETAZOLINE HCL 0.05 % NA SOLN
NASAL | Status: DC | PRN
  Administered 2017-10-29: 1 via TOPICAL

## 2017-10-29 MED ORDER — LACTATED RINGERS IV SOLN
INTRAVENOUS | Status: DC
  Administered 2017-10-29 (×2): via INTRAVENOUS

## 2017-10-29 MED ORDER — ROCURONIUM BROMIDE 50 MG/5ML IV SOSY
PREFILLED_SYRINGE | INTRAVENOUS | Status: AC
  Filled 2017-10-29: qty 5

## 2017-10-29 MED ORDER — SCOPOLAMINE 1 MG/3DAYS TD PT72
MEDICATED_PATCH | TRANSDERMAL | Status: AC
  Filled 2017-10-29: qty 1

## 2017-10-29 MED ORDER — FENTANYL CITRATE (PF) 100 MCG/2ML IJ SOLN
25.0000 ug | INTRAMUSCULAR | Status: DC | PRN
  Administered 2017-10-29: 25 ug via INTRAVENOUS

## 2017-10-29 MED ORDER — LIDOCAINE HCL (CARDIAC) PF 100 MG/5ML IV SOSY
PREFILLED_SYRINGE | INTRAVENOUS | Status: DC | PRN
  Administered 2017-10-29: 50 mg via INTRAVENOUS

## 2017-10-29 MED ORDER — MIDAZOLAM HCL 2 MG/2ML IJ SOLN
INTRAMUSCULAR | Status: AC
  Filled 2017-10-29: qty 2

## 2017-10-29 MED ORDER — CLINDAMYCIN PHOSPHATE 600 MG/50ML IV SOLN
INTRAVENOUS | Status: AC
  Filled 2017-10-29: qty 50

## 2017-10-29 MED ORDER — ONDANSETRON HCL 4 MG/2ML IJ SOLN
INTRAMUSCULAR | Status: DC | PRN
  Administered 2017-10-29: 4 mg via INTRAVENOUS

## 2017-10-29 MED ORDER — SCOPOLAMINE 1 MG/3DAYS TD PT72: 1.0000 | MEDICATED_PATCH | TRANSDERMAL | Status: DC

## 2017-10-29 MED ORDER — SUGAMMADEX SODIUM 200 MG/2ML IV SOLN
INTRAVENOUS | Status: DC | PRN
  Administered 2017-10-29: 250 mg via INTRAVENOUS

## 2017-10-29 MED ORDER — SCOPOLAMINE 1 MG/3DAYS TD PT72
1.0000 | MEDICATED_PATCH | Freq: Once | TRANSDERMAL | Status: DC | PRN
  Administered 2017-10-29: 1.5 mg via TRANSDERMAL

## 2017-10-29 MED ORDER — MIDAZOLAM HCL 2 MG/2ML IJ SOLN
1.0000 mg | INTRAMUSCULAR | Status: DC | PRN
  Administered 2017-10-29: 2 mg via INTRAVENOUS

## 2017-10-29 MED ORDER — ONDANSETRON HCL 4 MG/2ML IJ SOLN
INTRAMUSCULAR | Status: AC
  Filled 2017-10-29: qty 2

## 2017-10-29 MED ORDER — PROPOFOL 500 MG/50ML IV EMUL
INTRAVENOUS | Status: DC | PRN
  Administered 2017-10-29: 100 ug/kg/min via INTRAVENOUS

## 2017-10-29 SURGICAL SUPPLY — 40 items
ATTRACTOMAT 16X20 MAGNETIC DRP (DRAPES) IMPLANT
BLADE RAD40 ROTATE 4M 4 5PK (BLADE) IMPLANT
BLADE RAD60 ROTATE M4 4 5PK (BLADE) IMPLANT
BLADE TRICUT ROTATE M4 4 5PK (BLADE) IMPLANT
CANISTER SUC SOCK COL 7IN (MISCELLANEOUS) ×3 IMPLANT
CANISTER SUCT 1200ML W/VALVE (MISCELLANEOUS) ×2 IMPLANT
COAGULATOR SUCT 8FR VV (MISCELLANEOUS) ×1 IMPLANT
COAGULATOR SUCT SWTCH 10FR 6 (ELECTROSURGICAL) IMPLANT
DECANTER SPIKE VIAL GLASS SM (MISCELLANEOUS) IMPLANT
DRSG NASOPORE 8CM (GAUZE/BANDAGES/DRESSINGS) IMPLANT
ELECT REM PT RETURN 9FT ADLT (ELECTROSURGICAL) ×2
ELECTRODE REM PT RTRN 9FT ADLT (ELECTROSURGICAL) IMPLANT
GLOVE BIO SURGEON STRL SZ7.5 (GLOVE) ×2 IMPLANT
GLOVE BIOGEL PI IND STRL 7.0 (GLOVE) IMPLANT
GLOVE BIOGEL PI INDICATOR 7.0 (GLOVE) ×1
GLOVE SURG SYN 7.5  E (GLOVE) ×1
GLOVE SURG SYN 7.5 E (GLOVE) ×1 IMPLANT
GLOVE SURG SYN 7.5 PF PI (GLOVE) IMPLANT
GOWN STRL REUS W/ TWL LRG LVL3 (GOWN DISPOSABLE) ×2 IMPLANT
GOWN STRL REUS W/ TWL XL LVL3 (GOWN DISPOSABLE) IMPLANT
GOWN STRL REUS W/TWL LRG LVL3 (GOWN DISPOSABLE) ×2
GOWN STRL REUS W/TWL XL LVL3 (GOWN DISPOSABLE) ×2
HEMOSTAT SURGICEL 2X14 (HEMOSTASIS) IMPLANT
IV NS 500ML (IV SOLUTION)
IV NS 500ML BAXH (IV SOLUTION) ×1 IMPLANT
NDL HYPO 25X1 1.5 SAFETY (NEEDLE) ×1 IMPLANT
NEEDLE HYPO 25X1 1.5 SAFETY (NEEDLE) ×2 IMPLANT
NS IRRIG 1000ML POUR BTL (IV SOLUTION) ×1 IMPLANT
PACK BASIN DAY SURGERY FS (CUSTOM PROCEDURE TRAY) ×2 IMPLANT
PACK ENT DAY SURGERY (CUSTOM PROCEDURE TRAY) ×2 IMPLANT
PACKING NASAL EPIS 4X2.4 XEROG (MISCELLANEOUS) IMPLANT
SLEEVE SCD COMPRESS KNEE MED (MISCELLANEOUS) ×1 IMPLANT
SOLUTION BUTLER CLEAR DIP (MISCELLANEOUS) ×3 IMPLANT
SPONGE GAUZE 2X2 8PLY STRL LF (GAUZE/BANDAGES/DRESSINGS) ×2 IMPLANT
SPONGE NEURO XRAY DETECT 1X3 (DISPOSABLE) ×2 IMPLANT
SYR 50ML LL SCALE MARK (SYRINGE) ×1 IMPLANT
TOWEL GREEN STERILE FF (TOWEL DISPOSABLE) ×2 IMPLANT
TUBE CONNECTING 20X1/4 (TUBING) ×2 IMPLANT
TUBE SALEM SUMP 16 FR W/ARV (TUBING) IMPLANT
YANKAUER SUCT BULB TIP NO VENT (SUCTIONS) IMPLANT

## 2017-10-29 NOTE — Anesthesia Postprocedure Evaluation (Signed)
Anesthesia Post Note  Patient: Kristin [name]  Procedure(s) Performed: LEFT MAXILLARY ANTROSTOMY WITH TISSUE REMOVAL (Left Nose)     Patient location during evaluation: PACU Anesthesia Type: General Level of consciousness: awake and alert Pain management: pain level controlled Vital Signs Assessment: post-procedure vital signs reviewed and stable Respiratory status: spontaneous breathing, nonlabored ventilation, respiratory function stable and patient connected to nasal cannula oxygen Cardiovascular status: blood pressure returned to baseline and stable Postop Assessment: no apparent nausea or vomiting Anesthetic complications: no    Last Vitals:  Vitals:   10/29/17 1330 10/29/17 1357  BP: 116/70 124/71  Pulse: 69 74  Resp: 19 16  Temp:  36.5 C  SpO2: 94% 95%    Last Pain:  Vitals:   10/29/17 1357  TempSrc:   PainSc: 0-No pain                 Krissie Merrick L Patty Lopezgarcia

## 2017-10-29 NOTE — Anesthesia Procedure Notes (Signed)
Procedure Name: Intubation Performed by: Jarid Sasso M, CRNA Pre-anesthesia Checklist: Patient identified, Emergency Drugs available, Suction available, Patient being monitored and Timeout performed Patient Re-evaluated:Patient Re-evaluated prior to induction Oxygen Delivery Method: Circle system utilized Preoxygenation: Pre-oxygenation with 100% oxygen Induction Type: IV induction Ventilation: Mask ventilation without difficulty Laryngoscope Size: Mac and 3 Grade View: Grade I Tube type: Oral Tube size: 7.0 mm Number of attempts: 1 Airway Equipment and Method: Stylet Placement Confirmation: ETT inserted through vocal cords under direct vision,  positive ETCO2,  CO2 detector and breath sounds checked- equal and bilateral Secured at: 21 cm Tube secured with: Tape Dental Injury: Teeth and Oropharynx as per pre-operative assessment        

## 2017-10-29 NOTE — Transfer of Care (Signed)
Immediate Anesthesia Transfer of Care Note  Patient: Kristin [name]  Procedure(s) Performed: LEFT MAXILLARY ANTROSTOMY WITH TISSUE REMOVAL (Left Nose)  Patient Location: PACU  Anesthesia Type:General  Level of Consciousness: awake, alert  and oriented  Airway & Oxygen Therapy: Patient Spontanous Breathing and Patient connected to face mask oxygen  Post-op Assessment: Report given to RN and Post -op Vital signs reviewed and stable  Post vital signs: Reviewed and stable  Last Vitals:  Vitals Value Taken Time  BP 112/58 10/29/2017 12:39 PM  Temp    Pulse 72 10/29/2017 12:41 PM  Resp 28 10/29/2017 12:41 PM  SpO2 98 % 10/29/2017 12:41 PM  Vitals shown include unvalidated device data.  Last Pain:  Vitals:   10/29/17 1022  TempSrc: Oral  PainSc: 4       Patients Stated Pain Goal: 4 (23/76/28 3151)  Complications: No apparent anesthesia complications

## 2017-10-29 NOTE — Discharge Instructions (Addendum)

## 2017-10-29 NOTE — H&P (Signed)
Cc: Chronic left maxillary sinusitis  HPI: The patient is a 67 year old female who returns today for evaluation of her chronic left maxillary sinusitis.  The patient previously underwent endoscopic sinus surgery to treat her left chronic maxillary sinusitis.  She was noted to have a fungal ball within the left maxillary sinus.  She was last seen in 2017.  At that time, mucoid drainage was noted from the left maxillary sinus.  She was treated with azithromycin.  According to the patient, she was doing well without any sinus symptoms.  However, she recently underwent a dental x-ray.  She was noted to have opacification of her left maxillary sinus. The patient is scheduled to undergo left maxillary dental implant. She would like to have her left maxillary sinus evaluated for possible infection.    Exam: The nasal cavities were decongested and anesthetised with a combination of oxymetazoline and 4% lidocaine solution.  The flexible scope was inserted into the right nasal cavity.  Endoscopy of the inferior and middle meatus was performed.  Mildly edematous mucosa was noted.  No polyp, mass, or lesion was appreciated.  Olfactory cleft was clear.  Nasopharynx was clear.  Turbinates were hypertrophied but without mass.  The procedure was repeated on the contralateral side.  A large amount of fungal balls are noted within the left maxillary sinus.   The patient tolerated the procedure well.  Instructions were given to avoid eating or drinking for 2 hours.    Assessment: 1.  A large amount of fungal balls are noted within the left maxillary sinus.  No drainage is noted today.  2.  The rest of her sinonasal cavities are normal.   Plan: 1.  The nasal endoscopy findings are reviewed with the patient.  2.  Based on the above findings, the patient will need to undergo revision left maxillary antrostomy and removal of the fungal balls.   3.  The risks, benefits, and details of the procedure are reviewed with the  patient.

## 2017-10-29 NOTE — Anesthesia Preprocedure Evaluation (Addendum)
Anesthesia Evaluation  Patient identified by MRN, date of birth, ID band Patient awake    Reviewed: Allergy & Precautions, NPO status , Patient's Chart, lab work & pertinent test results  History of Anesthesia Complications (+) PONV and history of anesthetic complications  Airway Mallampati: II  TM Distance: >3 FB Neck ROM: Full    Dental no notable dental hx. (+) Teeth Intact, Dental Advisory Given   Pulmonary sleep apnea (does not use CPAP) , former smoker,    Pulmonary exam normal breath sounds clear to auscultation       Cardiovascular + CAD  Normal cardiovascular exam Rhythm:Regular Rate:Normal     Neuro/Psych negative neurological ROS  negative psych ROS   GI/Hepatic negative GI ROS, Neg liver ROS,   Endo/Other  Hypothyroidism   Renal/GU negative Renal ROS  negative genitourinary   Musculoskeletal  (+) Arthritis , Osteoarthritis,    Abdominal   Peds  Hematology negative hematology ROS (+)   Anesthesia Other Findings   Reproductive/Obstetrics                            Anesthesia Physical Anesthesia Plan  ASA: III  Anesthesia Plan: General   Post-op Pain Management:    Induction: Intravenous  PONV Risk Score and Plan: 4 or greater and Scopolamine patch - Pre-op, Midazolam, Dexamethasone, Ondansetron and TIVA  Airway Management Planned: Oral ETT  Additional Equipment:   Intra-op Plan:   Post-operative Plan: Extubation in OR  Informed Consent: I have reviewed the patients History and Physical, chart, labs and discussed the procedure including the risks, benefits and alternatives for the proposed anesthesia with the patient or authorized representative who has indicated his/her understanding and acceptance.   Dental advisory given  Plan Discussed with: CRNA  Anesthesia Plan Comments:         Anesthesia Quick Evaluation

## 2017-10-29 NOTE — Op Note (Signed)
DATE OF PROCEDURE: 10/29/2017  OPERATIVE REPORT   SURGEON: Leta Baptist, MD   PREOPERATIVE DIAGNOSES:  1. Chronic left maxillary fungal sinusitia  POSTOPERATIVE DIAGNOSES:  1. Chronic left maxillary fungal sinusitia  PROCEDURE PERFORMED:  1. Left endoscopic maxillary antrostomy with fungus ball removal  ANESTHESIA: General endotracheal tube anesthesia.   COMPLICATIONS: None.   ESTIMATED BLOOD LOSS: Minimal.  INDICATION FOR PROCEDURE: Kristin [name] is a 67 y.o. female with a history of chronic left maxillary sinusitis. The patient previously underwent endoscopic sinus surgery to treat her left chronic maxillary sinusitis. She was noted to have a large fungal ball within the left maxillary sinus. She was last treated in 2017. According to the patient, she has noted increasing mucoid drainage from her left maxillary sinus over the past few months. She was treated with oral antibiotics. On examination, she was noted to have a large amount of fungal balls within the left maxillary sinus, with mucopurulent drainage.  Based on the above findings, the decision was made for the patient to undergo the above-stated procedure. The risks, benefits, alternatives, and details of the procedures were discussed with the patient. Questions were invited and answered. Informed consent was obtained.   DESCRIPTION OF PROCEDURE: The patient was taken to the operating room and placed supine on the operating table. General endotracheal tube anesthesia was administered by the anesthesiologist. The patient was positioned, and prepped and draped in the standard fashion for nasal surgery. Pledgets soaked with Afrin were placed in both nasal cavities for decongestion. The pledgets were subsequently removed.  Using a 0 endoscope, the left maxillary sinus was examined. The middle turbinate was medialized. Examination of the middle meatus showed a patent left maxillary ostium. The ostial opening was carefully enlarged using a  combination of backbiter's and Tru-Cut forceps. A 70 scope was used to examine the left maxillary sinus. A large amount of fungal balls were noted within the left maxillary sinus. Mucopurulent drainage was also noted. The left maxillary sinus content was suctioned using suction catheters. The left maxillary sinus was copiously irrigated.  The care of the patient was turned over to the anesthesiologist. The patient was awakened from anesthesia without difficulty. The patient was extubated and transferred to the recovery room in good condition.   OPERATIVE FINDINGS: Left chronic maxillary fungal sinusitis.  SPECIMEN: Left maxillary sinus contents.  FOLLOWUP CARE: The patient be discharged home once she is awake and alert.  The patient will follow up in my office in approximately 1 week for splint removal.   Skila Rollins Raynelle Bring, MD

## 2017-10-30 ENCOUNTER — Encounter (HOSPITAL_BASED_OUTPATIENT_CLINIC_OR_DEPARTMENT_OTHER): Payer: Self-pay | Admitting: Otolaryngology

## 2017-11-04 ENCOUNTER — Ambulatory Visit (INDEPENDENT_AMBULATORY_CARE_PROVIDER_SITE_OTHER): Payer: Medicare Other | Admitting: Otolaryngology

## 2017-11-04 DIAGNOSIS — J320 Chronic maxillary sinusitis: Secondary | ICD-10-CM | POA: Diagnosis not present

## 2017-11-11 DIAGNOSIS — Z23 Encounter for immunization: Secondary | ICD-10-CM | POA: Diagnosis not present

## 2017-11-21 ENCOUNTER — Ambulatory Visit (INDEPENDENT_AMBULATORY_CARE_PROVIDER_SITE_OTHER): Payer: Medicare Other | Admitting: Otolaryngology

## 2017-11-21 DIAGNOSIS — J320 Chronic maxillary sinusitis: Secondary | ICD-10-CM

## 2017-11-29 DIAGNOSIS — E063 Autoimmune thyroiditis: Secondary | ICD-10-CM | POA: Diagnosis not present

## 2017-11-29 DIAGNOSIS — E038 Other specified hypothyroidism: Secondary | ICD-10-CM | POA: Diagnosis not present

## 2017-12-02 DIAGNOSIS — H40013 Open angle with borderline findings, low risk, bilateral: Secondary | ICD-10-CM | POA: Diagnosis not present

## 2017-12-03 DIAGNOSIS — Z0001 Encounter for general adult medical examination with abnormal findings: Secondary | ICD-10-CM | POA: Diagnosis not present

## 2017-12-03 DIAGNOSIS — E559 Vitamin D deficiency, unspecified: Secondary | ICD-10-CM | POA: Diagnosis not present

## 2017-12-03 DIAGNOSIS — E7849 Other hyperlipidemia: Secondary | ICD-10-CM | POA: Diagnosis not present

## 2017-12-03 DIAGNOSIS — Z6822 Body mass index (BMI) 22.0-22.9, adult: Secondary | ICD-10-CM | POA: Diagnosis not present

## 2017-12-03 DIAGNOSIS — E782 Mixed hyperlipidemia: Secondary | ICD-10-CM | POA: Diagnosis not present

## 2017-12-03 DIAGNOSIS — M810 Age-related osteoporosis without current pathological fracture: Secondary | ICD-10-CM | POA: Diagnosis not present

## 2018-01-07 DIAGNOSIS — E063 Autoimmune thyroiditis: Secondary | ICD-10-CM | POA: Diagnosis not present

## 2018-01-07 DIAGNOSIS — E038 Other specified hypothyroidism: Secondary | ICD-10-CM | POA: Diagnosis not present

## 2018-01-17 DIAGNOSIS — E063 Autoimmune thyroiditis: Secondary | ICD-10-CM | POA: Diagnosis not present

## 2018-01-17 DIAGNOSIS — E038 Other specified hypothyroidism: Secondary | ICD-10-CM | POA: Diagnosis not present

## 2018-05-01 DIAGNOSIS — R69 Illness, unspecified: Secondary | ICD-10-CM | POA: Diagnosis not present

## 2018-05-27 DIAGNOSIS — M47812 Spondylosis without myelopathy or radiculopathy, cervical region: Secondary | ICD-10-CM | POA: Diagnosis not present

## 2018-05-27 DIAGNOSIS — E063 Autoimmune thyroiditis: Secondary | ICD-10-CM | POA: Diagnosis not present

## 2018-05-27 DIAGNOSIS — K219 Gastro-esophageal reflux disease without esophagitis: Secondary | ICD-10-CM | POA: Diagnosis not present

## 2018-05-27 DIAGNOSIS — Z1389 Encounter for screening for other disorder: Secondary | ICD-10-CM | POA: Diagnosis not present

## 2018-05-27 DIAGNOSIS — Z6822 Body mass index (BMI) 22.0-22.9, adult: Secondary | ICD-10-CM | POA: Diagnosis not present

## 2018-06-21 ENCOUNTER — Emergency Department (HOSPITAL_COMMUNITY)
Admission: EM | Admit: 2018-06-21 | Discharge: 2018-06-21 | Disposition: A | Discharge status: Home / Self Care | Payer: Medicare HMO | Attending: Emergency Medicine | Admitting: Emergency Medicine

## 2018-06-21 ENCOUNTER — Emergency Department (HOSPITAL_COMMUNITY): Payer: Medicare HMO

## 2018-06-21 ENCOUNTER — Encounter (HOSPITAL_COMMUNITY): Payer: Self-pay

## 2018-06-21 ENCOUNTER — Other Ambulatory Visit: Payer: Self-pay

## 2018-06-21 DIAGNOSIS — R319 Hematuria, unspecified: Secondary | ICD-10-CM | POA: Diagnosis not present

## 2018-06-21 DIAGNOSIS — I2510 Atherosclerotic heart disease of native coronary artery without angina pectoris: Secondary | ICD-10-CM | POA: Diagnosis not present

## 2018-06-21 DIAGNOSIS — N23 Unspecified renal colic: Secondary | ICD-10-CM

## 2018-06-21 DIAGNOSIS — N201 Calculus of ureter: Secondary | ICD-10-CM

## 2018-06-21 DIAGNOSIS — Z87891 Personal history of nicotine dependence: Secondary | ICD-10-CM | POA: Diagnosis not present

## 2018-06-21 LAB — URINALYSIS, ROUTINE W REFLEX MICROSCOPIC
Bacteria, UA: NONE SEEN
Bilirubin Urine: NEGATIVE
Glucose, UA: 50 mg/dL — AB
Ketones, ur: 20 mg/dL — AB
Nitrite: NEGATIVE
Protein, ur: 100 mg/dL — AB
RBC / HPF: >50 RBC/hpf — ABNORMAL HIGH (ref 0–5)
Specific Gravity, Urine: 1.012 (ref 1.005–1.030)
pH: 6 (ref 5.0–8.0)

## 2018-06-21 MED ORDER — HYDROCODONE-ACETAMINOPHEN 5-325 MG PO TABS: ORAL_TABLET | ORAL | 0 refills | Status: DC

## 2018-06-21 MED ORDER — NITROFURANTOIN MONOHYD MACRO 100 MG PO CAPS: 100.0000 mg | ORAL_CAPSULE | Freq: Two times a day (BID) | ORAL | 0 refills | Status: DC

## 2018-06-21 MED ORDER — ONDANSETRON 4 MG PO TBDP: 4.0000 mg | ORAL_TABLET | Freq: Three times a day (TID) | ORAL | 0 refills | Status: DC | PRN

## 2018-06-21 MED ORDER — TAMSULOSIN HCL 0.4 MG PO CAPS: 0.4000 mg | ORAL_CAPSULE | Freq: Every day | ORAL | 0 refills | Status: DC

## 2018-06-21 NOTE — ED Notes (Signed)
Pt reports painful urination and hematuria  Since after lunch  Here for eval

## 2018-06-21 NOTE — Discharge Instructions (Addendum)
Your CT scan showed an incidental finding(s):  "Redemonstrated hypodense lesion of the pancreatic head is enlarged compared to prior examination dated 2018, measuring at least 2.6 x 2.3 cm, previously 1.9 cm (series 2, image 25). As on prior examinations, primary differential considerations are IPMN and serous cystadenoma. Given enlargement, recommend multiphasic contrast enhanced MRI and/or endoscopic ultrasound for further evaluation."  Take the prescriptions as directed.  Call your regular medical doctor on Monday to schedule a follow up appointment for your CT findings within the next week. Call the Urologist on Monday to schedule a follow up appointment within the next week.  Return to the Emergency Department immediately sooner if worsening.

## 2018-06-21 NOTE — ED Notes (Signed)
Dr McM in to assess 

## 2018-06-21 NOTE — ED Notes (Signed)
To CT

## 2018-06-21 NOTE — ED Triage Notes (Signed)
Pt reports painful urination and hematuria since 11am today.  Denies n/v/d.  Reprots history of kidney stones but says this doesn't feel like that.

## 2018-06-21 NOTE — ED Provider Notes (Signed)
[facility] EMERGENCY DEPARTMENT Provider Note   CSN: [id number] Arrival date & time: 06/21/18  1722    History   Chief Complaint Chief Complaint  Patient presents with   Dysuria   Hematuria    HPI Kristin [name] is a 68 y.o. female.     HPI  Pt was seen at 1750.  Per pt, c/o gradual onset and persistence of constant hematuria since this morning at 11am.  Has been associated with urinary frequency and dysuria.  Pt endorses hx kidney stones. Denies N/V, no diarrhea, no fevers, no fevers, no back pain, no rash, no CP/SOB, no black or blood in stools, no vaginal bleeding/discharge.      Past Medical History:  Diagnosis Date   Aortic atherosclerosis ([place]) 0/03/7251   Complication of anesthesia    long time to wake up 2018 with same surgery   Coronary atherosclerosis 09/30/2016   By CT findings   Dizziness    Hypothyroidism    Lumbar degenerative disc disease 09/30/2016   Maxillary sinusitis    Osteoporosis    PONV (postoperative nausea and vomiting)    Renal disorder    Sleep apnea    mild osa, does not use CPAP   Thyroid disease     Patient Active Problem List   Diagnosis Date Noted   Left flank pain 09/30/2016   LLQ pain 09/30/2016   Bilateral nephrolithiasis 09/30/2016   Lumbar degenerative disc disease 09/30/2016   Aortic atherosclerosis ([place]) 09/30/2016   Coronary atherosclerosis 09/30/2016   Pulmonary nodule 12/06/2011    Past Surgical History:  Procedure Laterality Date   BACK SURGERY  1997/1999   CARPAL TUNNEL RELEASE Right    CYSTECTOMY  1972   brreast   MAXILLARY ANTROSTOMY Left 08/09/2014   Procedure: ENDOSCOPIC MAXILLARY ANTROSTOMY;  Surgeon: Leta Baptist, MD;  Location: [place];  Service: ENT;  Laterality: Left;   MAXILLARY ANTROSTOMY Left 10/29/2017   Procedure: LEFT MAXILLARY ANTROSTOMY WITH TISSUE REMOVAL;  Surgeon: Leta Baptist, MD;  Location: [place];  Service: ENT;  Laterality: Left;   NASAL  SINUS SURGERY Left 08/09/2014   Procedure: ENDOSCOPIC SINUS SURGERY;  Surgeon: Leta Baptist, MD;  Location: [place];  Service: ENT;  Laterality: Left;   TUBAL LIGATION  1980     OB History    Gravida  1   Para  1   Term  0   Preterm  0   AB  0   Living  0     SAB  0   TAB  0   Ectopic  0   Multiple  0   Live Births               Home Medications    Prior to Admission medications   Medication Sig Start Date End Date Taking? Authorizing Provider  alendronate (FOSAMAX) 70 MG tablet Take 70 mg by mouth once a week. Take with a full glass of water on an empty stomach.   Yes [provider]  gabapentin (NEURONTIN) 100 MG capsule Take 100 mg by mouth 3 (three) times daily.   Yes [provider]  levothyroxine (SYNTHROID) 100 MCG tablet Take 100 mcg by mouth daily before breakfast.    Yes [provider]  Cholecalciferol (VITAMIN D) 2000 UNITS tablet Take 2,000 Units by mouth every evening.    [provider]  meclizine (ANTIVERT) 25 MG tablet Take 25 mg by mouth 3 (three) times daily as  needed for dizziness.    [provider]  Loma Boston (OYSTER CALCIUM) 500 MG TABS tablet Take 500 mg of elemental calcium by mouth every evening.    [provider]    Family History Family History  Problem Relation Age of Onset   Colon cancer Father    Stroke Paternal Grandfather    Cirrhosis Maternal Grandmother        liver   Hypertension Mother     Social History Social History   Tobacco Use   Smoking status: Former Smoker    Years: 3.00    Types: Cigarettes    Last attempt to quit: 01/30/1971    Years since quitting: 47.4   Smokeless tobacco: Never Used  Substance Use Topics   Alcohol use: No   Drug use: No     Allergies   Bee venom and Penicillins   Review of Systems Review of Systems ROS: Statement: All systems negative except as marked or noted in the HPI; Constitutional: Negative  for fever and chills. ; ; Eyes: Negative for eye pain, redness and discharge. ; ; ENMT: Negative for ear pain, hoarseness, nasal congestion, sinus pressure and sore throat. ; ; Cardiovascular: Negative for chest pain, palpitations, diaphoresis, dyspnea and peripheral edema. ; ; Respiratory: Negative for cough, wheezing and stridor. ; ; Gastrointestinal: Negative for nausea, vomiting, diarrhea, abdominal pain, blood in stool, hematemesis, jaundice and rectal bleeding. . ; ; Genitourinary: Negative for flank pain and +dysuria, urinary frequency, hematuria. ; ; GYN:  No pelvic pain, no vaginal bleeding, no vaginal discharge, no vulvar pain. ;; Musculoskeletal: Negative for back pain and neck pain. Negative for swelling and trauma.; ; Skin: Negative for pruritus, rash, abrasions, blisters, bruising and skin lesion.; ; Neuro: Negative for headache, lightheadedness and neck stiffness. Negative for weakness, altered level of consciousness, altered mental status, extremity weakness, paresthesias, involuntary movement, seizure and syncope.       Physical Exam Updated Vital Signs BP (!) 153/93    Pulse (!) 101    Temp 98.8 F (37.1 C) (Oral)    Resp 16    Ht 5\' 1"  (1.549 m)    Wt 52.2 kg    SpO2 97%    BMI 21.73 kg/m   BP 136/89 (BP Location: Right Arm)    Pulse 91    Temp 98.8 F (37.1 C) (Oral)    Resp 16    Ht 5\' 1"  (1.549 m)    Wt 52.2 kg    SpO2 97%    BMI 21.73 kg/m    Physical Exam 1755: Physical examination:  Nursing notes reviewed; Vital signs and O2 SAT reviewed;  Constitutional: Well developed, Well nourished, Well hydrated, In no acute distress; Head:  Normocephalic, atraumatic; Eyes: EOMI, PERRL, No scleral icterus; ENMT: Mouth and pharynx normal, Mucous membranes moist; Neck: Supple, Full range of motion, No lymphadenopathy; Cardiovascular: Regular rate and rhythm, No gallop; Respiratory: Breath sounds clear & equal bilaterally, No wheezes.  Speaking full sentences with ease, Normal respiratory  effort/excursion; Chest: Nontender, Movement normal; Abdomen: Soft, Nontender, Nondistended, Normal bowel sounds; Genitourinary: No CVA tenderness; Extremities: Peripheral pulses normal, No tenderness, No edema, No calf edema or asymmetry.; Neuro: AA&Ox3, Major CN grossly intact.  Speech clear. No gross focal motor or sensory deficits in extremities. Climbs on and off stretcher easily by herself. Gait steady.; Skin: Color normal, Warm, Dry.     ED Treatments / Results  Labs (all labs ordered are listed, but only abnormal results are displayed)  EKG None  Radiology   Procedures Procedures (including critical care time)  Medications Ordered in ED Medications - No data to display   Initial Impression / Assessment and Plan / ED Course  I have reviewed the triage vital signs and the nursing notes.  Pertinent labs & imaging results that were available during my care of the patient were reviewed by me and considered in my medical decision making (see chart for details).     MDM Reviewed: previous chart, nursing note and vitals Reviewed previous: labs Interpretation: labs and CT scan   Results for orders placed or performed during the hospital encounter of 06/21/18  Urinalysis, Routine w reflex microscopic  Result Value Ref Range   Color, Urine RED (A) YELLOW   APPearance CLOUDY (A) CLEAR   Specific Gravity, Urine 1.012 1.005 - 1.030   pH 6.0 5.0 - 8.0   Glucose, UA 50 (A) NEGATIVE mg/dL   Hgb urine dipstick LARGE (A) NEGATIVE   Bilirubin Urine NEGATIVE NEGATIVE   Ketones, ur 20 (A) NEGATIVE mg/dL   Protein, ur 100 (A) NEGATIVE mg/dL   Nitrite NEGATIVE NEGATIVE   Leukocytes,Ua MODERATE (A) NEGATIVE   RBC / HPF >50 (H) 0 - 5 RBC/hpf   WBC, UA 11-20 0 - 5 WBC/hpf   Bacteria, UA NONE SEEN NONE SEEN   Non Squamous Epithelial 0-5 (A) NONE SEEN   Ct Renal Stone Study Result Date: 06/21/2018 CLINICAL DATA:  Painful urination, hematuria EXAM: CT ABDOMEN AND PELVIS WITHOUT  CONTRAST TECHNIQUE: Multidetector CT imaging of the abdomen and pelvis was performed following the standard protocol without IV contrast. COMPARISON:  09/30/2016, PET-CT, 12/20/2011 FINDINGS: Lower chest: No acute abnormality. Hepatobiliary: No focal liver abnormality is seen. No gallstones, gallbladder wall thickening, or biliary dilatation. Pancreas: Redemonstrated hypodense lesion of the pancreatic head is enlarged compared to prior examination dated 2018, measuring at least 2.6 x 2.3 cm, previously 1.9 cm (series 2, image 25). No pancreatic ductal dilatation or surrounding inflammatory changes. Spleen: Normal in size without focal abnormality. Adrenals/Urinary Tract: Adrenal glands are unremarkable. There is an elongated, 5 mm calculus in the middle third of the left ureter overlying the iliac vessels with mild associated left hydronephrosis and hydroureter (series 2, image 50). This calculus was previously seen within the kidney on prior examination. Additional nonobstructive calculus remains in the inferior pole of the left kidney. The bladder is unremarkable. Stomach/Bowel: Stomach is within normal limits. Appendix appears normal. No evidence of bowel wall thickening, distention, or inflammatory changes. Vascular/Lymphatic: Scattered calcific atherosclerosis. No enlarged abdominal or pelvic lymph nodes. Reproductive: No mass or other abnormality. Other: No abdominal wall hernia or abnormality. No abdominopelvic ascites. Musculoskeletal: No acute or significant osseous findings. IMPRESSION: 1. There is an elongated, 5 mm calculus in the middle third of the left ureter overlying the iliac vessels with mild associated left hydronephrosis and hydroureter (series 2, image 50). This calculus was previously seen within the kidney on prior examination. Additional nonobstructive calculus remains in the inferior pole of the left kidney. 2. Redemonstrated hypodense lesion of the pancreatic head is enlarged compared to  prior examination dated 2018, measuring at least 2.6 x 2.3 cm, previously 1.9 cm (series 2, image 25). As on prior examinations, primary differential considerations are IPMN and serous cystadenoma. Given enlargement, recommend multiphasic contrast enhanced MRI and/or endoscopic ultrasound for further evaluation. Electronically Signed   By: Eddie Candle M.D.   On: 06/21/2018 18:47    Kristin [name] was evaluated in Emergency Department  on 06/21/2018 for the symptoms described in the history of present illness. She was evaluated in the context of the global COVID-19 pandemic, which necessitated consideration that the patient might be at risk for infection with the SARS-CoV-2 virus that causes COVID-19. Institutional protocols and algorithms that pertain to the evaluation of patients at risk for COVID-19 are in a state of rapid change based on information released by regulatory bodies including the CDC and federal and state organizations. These policies and algorithms were followed during the patient's care in the ED.     1955:  CT with ureteral calculi. No clear UTI on UDip but pt does c/o discomfort with voiding; UC pending. Tx symptomatically, f/u Uro MD. Dx and testing, as well as incidental finding(s), d/w pt.  Questions answered.  Verb understanding, agreeable to d/c home with outpt f/u.      Final Clinical Impressions(s) / ED Diagnoses   Final diagnoses:  None    ED Discharge Orders    None       Francine Graven, DO 06/23/18 1639

## 2018-06-23 LAB — URINE CULTURE

## 2018-06-25 DIAGNOSIS — N281 Cyst of kidney, acquired: Secondary | ICD-10-CM | POA: Diagnosis not present

## 2018-06-25 DIAGNOSIS — N202 Calculus of kidney with calculus of ureter: Secondary | ICD-10-CM | POA: Diagnosis not present

## 2018-06-26 DIAGNOSIS — Z6821 Body mass index (BMI) 21.0-21.9, adult: Secondary | ICD-10-CM | POA: Diagnosis not present

## 2018-06-26 DIAGNOSIS — K8689 Other specified diseases of pancreas: Secondary | ICD-10-CM | POA: Diagnosis not present

## 2018-06-26 DIAGNOSIS — N200 Calculus of kidney: Secondary | ICD-10-CM | POA: Diagnosis not present

## 2018-06-26 DIAGNOSIS — Z0001 Encounter for general adult medical examination with abnormal findings: Secondary | ICD-10-CM | POA: Diagnosis not present

## 2018-06-26 DIAGNOSIS — Z1389 Encounter for screening for other disorder: Secondary | ICD-10-CM | POA: Diagnosis not present

## 2018-06-27 ENCOUNTER — Other Ambulatory Visit: Payer: Self-pay | Admitting: Family Medicine

## 2018-06-27 ENCOUNTER — Other Ambulatory Visit (HOSPITAL_COMMUNITY): Payer: Self-pay | Admitting: Family Medicine

## 2018-06-27 DIAGNOSIS — K8689 Other specified diseases of pancreas: Secondary | ICD-10-CM

## 2018-07-03 DIAGNOSIS — R69 Illness, unspecified: Secondary | ICD-10-CM | POA: Diagnosis not present

## 2018-07-08 DIAGNOSIS — N200 Calculus of kidney: Secondary | ICD-10-CM | POA: Diagnosis not present

## 2018-07-11 ENCOUNTER — Ambulatory Visit (HOSPITAL_COMMUNITY)
Admission: RE | Admit: 2018-07-11 | Discharge: 2018-07-11 | Disposition: A | Discharge status: Home / Self Care | Payer: Medicare HMO | Source: Ambulatory Visit | Attending: Family Medicine | Admitting: Family Medicine

## 2018-07-11 ENCOUNTER — Other Ambulatory Visit: Payer: Self-pay

## 2018-07-11 ENCOUNTER — Other Ambulatory Visit (HOSPITAL_COMMUNITY): Payer: Self-pay | Admitting: Family Medicine

## 2018-07-11 DIAGNOSIS — K8689 Other specified diseases of pancreas: Secondary | ICD-10-CM | POA: Diagnosis present

## 2018-07-11 DIAGNOSIS — K862 Cyst of pancreas: Secondary | ICD-10-CM | POA: Diagnosis not present

## 2018-07-11 LAB — POCT I-STAT CREATININE: Creatinine, Ser: 0.7 mg/dL (ref 0.44–1.00)

## 2018-07-11 MED ORDER — GADOBUTROL 1 MMOL/ML IV SOLN
5.0000 mL | Freq: Once | INTRAVENOUS | Status: AC | PRN
  Administered 2018-07-11: 5 mL via INTRAVENOUS

## 2018-07-16 ENCOUNTER — Encounter (INDEPENDENT_AMBULATORY_CARE_PROVIDER_SITE_OTHER): Payer: Self-pay | Admitting: Internal Medicine

## 2018-07-16 ENCOUNTER — Other Ambulatory Visit: Payer: Self-pay

## 2018-07-16 ENCOUNTER — Telehealth: Payer: Self-pay | Admitting: Gastroenterology

## 2018-07-16 ENCOUNTER — Ambulatory Visit (INDEPENDENT_AMBULATORY_CARE_PROVIDER_SITE_OTHER): Payer: Medicare HMO | Admitting: Internal Medicine

## 2018-07-16 VITALS — BP 134/73 | HR 68 | Temp 97.9°F | Ht 61.5 in | Wt 114.5 lb

## 2018-07-16 DIAGNOSIS — K8689 Other specified diseases of pancreas: Secondary | ICD-10-CM | POA: Diagnosis not present

## 2018-07-16 NOTE — Telephone Encounter (Signed)
Dr Ardis Hughs please review for EUS

## 2018-07-16 NOTE — Patient Instructions (Signed)
Will refer to Dr. Ardis Hughs for possible EUS.

## 2018-07-16 NOTE — Progress Notes (Signed)
Subjective:    Patient ID: Kristin [name], female    DOB: [birth year], 68 y.o.   MRN: [id number]  HPI Referred by Dr. Hilma Favors for pancreatic mass. Underwent an MRI (pancreatic mass) which revealed IMPRESSION: 1. 2.8 x 2.8 cm multicystic lesion in the head of the pancreas without obvious central scar today or evidence of calcification on previous CT. Communication to the main pancreatic duct can not be determined. Differential considerations would include serous cystic neoplasm and side branch IPMN. Endoscopic ultrasound recommended for further characterization. She had undergone a CT Renal Stone study earlier and pancreatic lesion was again noted. Had enlarged compared to prior examination date 2018. Had been in the ED for a kidney stone. She tells me she is doing okay. Her appetite is okay. No weight loss. BMs are normal.  No family hx of pancreatic cancer.     Review of Systems Past Medical History:  Diagnosis Date  . Aortic atherosclerosis ([place]) 09/30/2016  . Complication of anesthesia    long time to wake up 2018 with same surgery  . Coronary atherosclerosis 09/30/2016   By CT findings  . Dizziness   . Hypothyroidism   . Lumbar degenerative disc disease 09/30/2016  . Maxillary sinusitis   . Osteoporosis   . PONV (postoperative nausea and vomiting)   . Renal disorder   . Sleep apnea    mild osa, does not use CPAP  . Thyroid disease     Past Surgical History:  Procedure Laterality Date  . BACK SURGERY  1997/1999  . CARPAL TUNNEL RELEASE Right   . CYSTECTOMY  1972   brreast  . MAXILLARY ANTROSTOMY Left 08/09/2014   Procedure: ENDOSCOPIC MAXILLARY ANTROSTOMY;  Surgeon: Leta Baptist, MD;  Location: [place];  Service: ENT;  Laterality: Left;  Marland Kitchen MAXILLARY ANTROSTOMY Left 10/29/2017   Procedure: LEFT MAXILLARY ANTROSTOMY WITH TISSUE REMOVAL;  Surgeon: Leta Baptist, MD;  Location: [place];  Service: ENT;  Laterality: Left;  . NASAL SINUS SURGERY Left  08/09/2014   Procedure: ENDOSCOPIC SINUS SURGERY;  Surgeon: Leta Baptist, MD;  Location: [place];  Service: ENT;  Laterality: Left;  . TUBAL LIGATION  1980    Allergies  Allergen Reactions  . Bee Venom Anaphylaxis  . Penicillins Swelling    Has patient had a PCN reaction causing immediate rash, facial/tongue/throat swelling, SOB or lightheadedness with hypotension: No Has patient had a PCN reaction causing severe rash involving mucus membranes or skin necrosis: No Has patient had a PCN reaction that required hospitalization: No Has patient had a PCN reaction occurring within the last 10 years: No If all of the above answers are "NO", then may proceed with Cephalosporin use.      Current Outpatient Medications on File Prior to Visit  Medication Sig Dispense Refill  . alendronate (FOSAMAX) 70 MG tablet Take 70 mg by mouth once a week. Take with a full glass of water on an empty stomach.    . Cholecalciferol (VITAMIN D) 2000 UNITS tablet Take 2,000 Units by mouth every evening.    Marland Kitchen levothyroxine (SYNTHROID) 100 MCG tablet Take 100 mcg by mouth daily before breakfast.     . meclizine (ANTIVERT) 25 MG tablet Take 25 mg by mouth 3 (three) times daily as needed for dizziness.    Loma Boston (OYSTER CALCIUM) 500 MG TABS tablet Take 500 mg of elemental calcium by mouth every evening.     No current facility-administered medications on file prior  to visit.         Objective:   Physical Exam Blood pressure 134/73, pulse 68, temperature 97.9 F (36.6 C), height 5' 1.5" (1.562 m), weight 114 lb 8 oz (51.9 kg). Alert and oriented. Skin warm and dry. Oral mucosa is moist.   . Sclera anicteric, conjunctivae is pink. Thyroid not enlarged. No cervical lymphadenopathy. Lungs clear. Heart regular rate and rhythm.  Abdomen is soft. Bowel sounds are positive. No hepatomegaly. No abdominal masses felt. No tenderness.  No edema to lower extremities.         Assessment & Plan:   Pancreatic mass. Will refer to Dr. Ardis Hughs for possible EUS ? Biopsy.

## 2018-07-16 NOTE — Telephone Encounter (Signed)
Patty, please see urgent referral from [facility]. Pt is being referred to Dr. Ardis Hughs for pancreatic mass

## 2018-07-17 NOTE — Telephone Encounter (Signed)
Please see Dr. Ardis Hughs recommendation

## 2018-07-17 NOTE — Telephone Encounter (Signed)
The cystic lesion in the pancreas has been present since at least 2011 (based on previous CTs). It is probably a bit larger now but sill has zero out of three criteria to warrant invasive testing such as EUS.  I recommend they either ignore the lesion (since it has been present since 2011) OR follow with a repeat MRI in 1 year.  Thanks

## 2018-07-18 DIAGNOSIS — E063 Autoimmune thyroiditis: Secondary | ICD-10-CM | POA: Diagnosis not present

## 2018-07-18 DIAGNOSIS — E038 Other specified hypothyroidism: Secondary | ICD-10-CM | POA: Diagnosis not present

## 2018-07-21 ENCOUNTER — Telehealth (INDEPENDENT_AMBULATORY_CARE_PROVIDER_SITE_OTHER): Payer: Self-pay | Admitting: Internal Medicine

## 2018-07-21 NOTE — Telephone Encounter (Signed)
1 yr MRI noted, in recall

## 2018-07-21 NOTE — Telephone Encounter (Signed)
Kristin [name], MR abdomen (MRCP) w/wo CM in 1 year. Diagnoses; Pancreatic cyst

## 2018-07-21 NOTE — Telephone Encounter (Signed)
Noted, I have spoken with patient. Will put on a recall for MR abdomen in 1 year

## 2018-07-23 DIAGNOSIS — E063 Autoimmune thyroiditis: Secondary | ICD-10-CM | POA: Diagnosis not present

## 2018-07-23 DIAGNOSIS — E038 Other specified hypothyroidism: Secondary | ICD-10-CM | POA: Diagnosis not present

## 2018-09-01 DIAGNOSIS — E038 Other specified hypothyroidism: Secondary | ICD-10-CM | POA: Diagnosis not present

## 2018-09-01 DIAGNOSIS — E063 Autoimmune thyroiditis: Secondary | ICD-10-CM | POA: Diagnosis not present

## 2018-10-22 DIAGNOSIS — E039 Hypothyroidism, unspecified: Secondary | ICD-10-CM | POA: Diagnosis not present

## 2018-10-22 DIAGNOSIS — E063 Autoimmune thyroiditis: Secondary | ICD-10-CM | POA: Diagnosis not present

## 2018-10-22 DIAGNOSIS — E038 Other specified hypothyroidism: Secondary | ICD-10-CM | POA: Diagnosis not present

## 2018-10-22 DIAGNOSIS — R69 Illness, unspecified: Secondary | ICD-10-CM | POA: Diagnosis not present

## 2018-10-24 DIAGNOSIS — E063 Autoimmune thyroiditis: Secondary | ICD-10-CM | POA: Diagnosis not present

## 2018-10-24 DIAGNOSIS — E038 Other specified hypothyroidism: Secondary | ICD-10-CM | POA: Diagnosis not present

## 2018-10-30 DIAGNOSIS — R7309 Other abnormal glucose: Secondary | ICD-10-CM | POA: Diagnosis not present

## 2018-10-30 DIAGNOSIS — E7849 Other hyperlipidemia: Secondary | ICD-10-CM | POA: Diagnosis not present

## 2018-10-30 DIAGNOSIS — M810 Age-related osteoporosis without current pathological fracture: Secondary | ICD-10-CM | POA: Diagnosis not present

## 2018-10-30 DIAGNOSIS — E559 Vitamin D deficiency, unspecified: Secondary | ICD-10-CM | POA: Diagnosis not present

## 2018-10-30 DIAGNOSIS — R42 Dizziness and giddiness: Secondary | ICD-10-CM | POA: Diagnosis not present

## 2018-10-30 DIAGNOSIS — Z6821 Body mass index (BMI) 21.0-21.9, adult: Secondary | ICD-10-CM | POA: Diagnosis not present

## 2018-10-30 DIAGNOSIS — E039 Hypothyroidism, unspecified: Secondary | ICD-10-CM | POA: Diagnosis not present

## 2018-11-17 DIAGNOSIS — H40013 Open angle with borderline findings, low risk, bilateral: Secondary | ICD-10-CM | POA: Diagnosis not present

## 2018-11-17 DIAGNOSIS — H04123 Dry eye syndrome of bilateral lacrimal glands: Secondary | ICD-10-CM | POA: Diagnosis not present

## 2018-11-17 DIAGNOSIS — H2513 Age-related nuclear cataract, bilateral: Secondary | ICD-10-CM | POA: Diagnosis not present

## 2018-11-17 DIAGNOSIS — H25013 Cortical age-related cataract, bilateral: Secondary | ICD-10-CM | POA: Diagnosis not present

## 2018-12-02 DIAGNOSIS — K5900 Constipation, unspecified: Secondary | ICD-10-CM | POA: Diagnosis not present

## 2018-12-02 DIAGNOSIS — Z800 Family history of malignant neoplasm of digestive organs: Secondary | ICD-10-CM | POA: Diagnosis not present

## 2018-12-02 DIAGNOSIS — Z1211 Encounter for screening for malignant neoplasm of colon: Secondary | ICD-10-CM | POA: Diagnosis not present

## 2018-12-02 DIAGNOSIS — Z7689 Persons encountering health services in other specified circumstances: Secondary | ICD-10-CM | POA: Diagnosis not present

## 2018-12-03 DIAGNOSIS — Z01419 Encounter for gynecological examination (general) (routine) without abnormal findings: Secondary | ICD-10-CM | POA: Diagnosis not present

## 2018-12-03 DIAGNOSIS — Z1231 Encounter for screening mammogram for malignant neoplasm of breast: Secondary | ICD-10-CM | POA: Diagnosis not present

## 2018-12-03 DIAGNOSIS — M810 Age-related osteoporosis without current pathological fracture: Secondary | ICD-10-CM | POA: Diagnosis not present

## 2018-12-03 DIAGNOSIS — Z6823 Body mass index (BMI) 23.0-23.9, adult: Secondary | ICD-10-CM | POA: Diagnosis not present

## 2018-12-03 DIAGNOSIS — Z1211 Encounter for screening for malignant neoplasm of colon: Secondary | ICD-10-CM | POA: Diagnosis not present

## 2018-12-11 DIAGNOSIS — E038 Other specified hypothyroidism: Secondary | ICD-10-CM | POA: Diagnosis not present

## 2018-12-11 DIAGNOSIS — E063 Autoimmune thyroiditis: Secondary | ICD-10-CM | POA: Diagnosis not present

## 2018-12-12 DIAGNOSIS — D120 Benign neoplasm of cecum: Secondary | ICD-10-CM | POA: Diagnosis not present

## 2018-12-12 DIAGNOSIS — K6389 Other specified diseases of intestine: Secondary | ICD-10-CM | POA: Diagnosis not present

## 2018-12-12 DIAGNOSIS — Z1211 Encounter for screening for malignant neoplasm of colon: Secondary | ICD-10-CM | POA: Diagnosis not present

## 2018-12-12 DIAGNOSIS — Z800 Family history of malignant neoplasm of digestive organs: Secondary | ICD-10-CM | POA: Diagnosis not present

## 2018-12-12 DIAGNOSIS — K635 Polyp of colon: Secondary | ICD-10-CM | POA: Diagnosis not present

## 2019-01-06 DIAGNOSIS — R69 Illness, unspecified: Secondary | ICD-10-CM | POA: Diagnosis not present

## 2019-01-08 ENCOUNTER — Other Ambulatory Visit: Payer: Self-pay

## 2019-01-08 DIAGNOSIS — Z20822 Contact with and (suspected) exposure to covid-19: Secondary | ICD-10-CM

## 2019-01-09 LAB — NOVEL CORONAVIRUS, NAA: SARS-CoV-2, NAA: NOT DETECTED

## 2019-01-16 DIAGNOSIS — S52032A Displaced fracture of olecranon process with intraarticular extension of left ulna, initial encounter for closed fracture: Secondary | ICD-10-CM | POA: Diagnosis not present

## 2019-01-16 DIAGNOSIS — M25522 Pain in left elbow: Secondary | ICD-10-CM | POA: Diagnosis not present

## 2019-01-19 DIAGNOSIS — W19XXXA Unspecified fall, initial encounter: Secondary | ICD-10-CM | POA: Diagnosis not present

## 2019-01-19 DIAGNOSIS — S52032A Displaced fracture of olecranon process with intraarticular extension of left ulna, initial encounter for closed fracture: Secondary | ICD-10-CM | POA: Diagnosis not present

## 2019-01-28 DIAGNOSIS — S52032A Displaced fracture of olecranon process with intraarticular extension of left ulna, initial encounter for closed fracture: Secondary | ICD-10-CM | POA: Diagnosis not present

## 2019-01-28 DIAGNOSIS — Z4789 Encounter for other orthopedic aftercare: Secondary | ICD-10-CM | POA: Diagnosis not present

## 2019-01-28 DIAGNOSIS — M25522 Pain in left elbow: Secondary | ICD-10-CM | POA: Diagnosis not present

## 2019-02-10 DIAGNOSIS — M25522 Pain in left elbow: Secondary | ICD-10-CM | POA: Diagnosis not present

## 2019-02-13 DIAGNOSIS — M25522 Pain in left elbow: Secondary | ICD-10-CM | POA: Diagnosis not present

## 2019-02-18 DIAGNOSIS — M25522 Pain in left elbow: Secondary | ICD-10-CM | POA: Diagnosis not present

## 2019-02-23 DIAGNOSIS — E038 Other specified hypothyroidism: Secondary | ICD-10-CM | POA: Diagnosis not present

## 2019-02-23 DIAGNOSIS — E063 Autoimmune thyroiditis: Secondary | ICD-10-CM | POA: Diagnosis not present

## 2019-02-23 DIAGNOSIS — M25522 Pain in left elbow: Secondary | ICD-10-CM | POA: Diagnosis not present

## 2019-02-23 DIAGNOSIS — E039 Hypothyroidism, unspecified: Secondary | ICD-10-CM | POA: Diagnosis not present

## 2019-02-25 DIAGNOSIS — S52032A Displaced fracture of olecranon process with intraarticular extension of left ulna, initial encounter for closed fracture: Secondary | ICD-10-CM | POA: Diagnosis not present

## 2019-02-25 DIAGNOSIS — M25522 Pain in left elbow: Secondary | ICD-10-CM | POA: Diagnosis not present

## 2019-02-25 DIAGNOSIS — E038 Other specified hypothyroidism: Secondary | ICD-10-CM | POA: Diagnosis not present

## 2019-02-25 DIAGNOSIS — E063 Autoimmune thyroiditis: Secondary | ICD-10-CM | POA: Diagnosis not present

## 2019-02-25 DIAGNOSIS — Z4789 Encounter for other orthopedic aftercare: Secondary | ICD-10-CM | POA: Diagnosis not present

## 2019-03-03 DIAGNOSIS — M25522 Pain in left elbow: Secondary | ICD-10-CM | POA: Diagnosis not present

## 2019-03-05 DIAGNOSIS — S6290XS Unspecified fracture of unspecified wrist and hand, sequela: Secondary | ICD-10-CM | POA: Diagnosis not present

## 2019-03-10 DIAGNOSIS — M25522 Pain in left elbow: Secondary | ICD-10-CM | POA: Diagnosis not present

## 2019-03-18 DIAGNOSIS — R69 Illness, unspecified: Secondary | ICD-10-CM | POA: Diagnosis not present

## 2019-03-25 DIAGNOSIS — S52032A Displaced fracture of olecranon process with intraarticular extension of left ulna, initial encounter for closed fracture: Secondary | ICD-10-CM | POA: Diagnosis not present

## 2019-03-25 DIAGNOSIS — M25522 Pain in left elbow: Secondary | ICD-10-CM | POA: Diagnosis not present

## 2019-03-25 DIAGNOSIS — Z4789 Encounter for other orthopedic aftercare: Secondary | ICD-10-CM | POA: Diagnosis not present

## 2019-03-26 DIAGNOSIS — R69 Illness, unspecified: Secondary | ICD-10-CM | POA: Diagnosis not present

## 2019-03-31 DIAGNOSIS — M25522 Pain in left elbow: Secondary | ICD-10-CM | POA: Diagnosis not present

## 2019-04-03 DIAGNOSIS — M25522 Pain in left elbow: Secondary | ICD-10-CM | POA: Diagnosis not present

## 2019-04-07 DIAGNOSIS — M25522 Pain in left elbow: Secondary | ICD-10-CM | POA: Diagnosis not present

## 2019-04-09 DIAGNOSIS — M25522 Pain in left elbow: Secondary | ICD-10-CM | POA: Diagnosis not present

## 2019-04-14 DIAGNOSIS — M25522 Pain in left elbow: Secondary | ICD-10-CM | POA: Diagnosis not present

## 2019-04-15 DIAGNOSIS — R69 Illness, unspecified: Secondary | ICD-10-CM | POA: Diagnosis not present

## 2019-04-20 DIAGNOSIS — M25522 Pain in left elbow: Secondary | ICD-10-CM | POA: Diagnosis not present

## 2019-04-23 DIAGNOSIS — M25522 Pain in left elbow: Secondary | ICD-10-CM | POA: Diagnosis not present

## 2019-04-28 DIAGNOSIS — M25522 Pain in left elbow: Secondary | ICD-10-CM | POA: Diagnosis not present

## 2019-04-29 DIAGNOSIS — S52032A Displaced fracture of olecranon process with intraarticular extension of left ulna, initial encounter for closed fracture: Secondary | ICD-10-CM | POA: Diagnosis not present

## 2019-05-08 DIAGNOSIS — M25522 Pain in left elbow: Secondary | ICD-10-CM | POA: Diagnosis not present

## 2019-05-21 DIAGNOSIS — S52032D Displaced fracture of olecranon process with intraarticular extension of left ulna, subsequent encounter for closed fracture with routine healing: Secondary | ICD-10-CM | POA: Diagnosis not present

## 2019-05-21 DIAGNOSIS — M25522 Pain in left elbow: Secondary | ICD-10-CM | POA: Diagnosis not present

## 2019-05-21 DIAGNOSIS — M25622 Stiffness of left elbow, not elsewhere classified: Secondary | ICD-10-CM | POA: Diagnosis not present

## 2019-05-25 DIAGNOSIS — M25522 Pain in left elbow: Secondary | ICD-10-CM | POA: Diagnosis not present

## 2019-06-03 DIAGNOSIS — M25522 Pain in left elbow: Secondary | ICD-10-CM | POA: Diagnosis not present

## 2019-06-03 DIAGNOSIS — S52032A Displaced fracture of olecranon process with intraarticular extension of left ulna, initial encounter for closed fracture: Secondary | ICD-10-CM | POA: Diagnosis not present

## 2019-06-12 DIAGNOSIS — M25522 Pain in left elbow: Secondary | ICD-10-CM | POA: Diagnosis not present

## 2019-06-16 ENCOUNTER — Encounter (INDEPENDENT_AMBULATORY_CARE_PROVIDER_SITE_OTHER): Payer: Self-pay | Admitting: *Deleted

## 2019-06-19 DIAGNOSIS — H40033 Anatomical narrow angle, bilateral: Secondary | ICD-10-CM | POA: Diagnosis not present

## 2019-06-19 DIAGNOSIS — H40013 Open angle with borderline findings, low risk, bilateral: Secondary | ICD-10-CM | POA: Diagnosis not present

## 2019-06-19 DIAGNOSIS — H02831 Dermatochalasis of right upper eyelid: Secondary | ICD-10-CM | POA: Diagnosis not present

## 2019-06-19 DIAGNOSIS — H04123 Dry eye syndrome of bilateral lacrimal glands: Secondary | ICD-10-CM | POA: Diagnosis not present

## 2019-06-20 DIAGNOSIS — M25622 Stiffness of left elbow, not elsewhere classified: Secondary | ICD-10-CM | POA: Diagnosis not present

## 2019-06-20 DIAGNOSIS — S52032D Displaced fracture of olecranon process with intraarticular extension of left ulna, subsequent encounter for closed fracture with routine healing: Secondary | ICD-10-CM | POA: Diagnosis not present

## 2019-06-24 ENCOUNTER — Other Ambulatory Visit (INDEPENDENT_AMBULATORY_CARE_PROVIDER_SITE_OTHER): Payer: Self-pay | Admitting: *Deleted

## 2019-06-24 DIAGNOSIS — K862 Cyst of pancreas: Secondary | ICD-10-CM

## 2019-06-26 DIAGNOSIS — M25522 Pain in left elbow: Secondary | ICD-10-CM | POA: Diagnosis not present

## 2019-07-01 DIAGNOSIS — Z4789 Encounter for other orthopedic aftercare: Secondary | ICD-10-CM | POA: Diagnosis not present

## 2019-07-01 DIAGNOSIS — M25522 Pain in left elbow: Secondary | ICD-10-CM | POA: Diagnosis not present

## 2019-07-07 DIAGNOSIS — M25522 Pain in left elbow: Secondary | ICD-10-CM | POA: Diagnosis not present

## 2019-07-14 DIAGNOSIS — M25522 Pain in left elbow: Secondary | ICD-10-CM | POA: Diagnosis not present

## 2019-07-15 ENCOUNTER — Other Ambulatory Visit (INDEPENDENT_AMBULATORY_CARE_PROVIDER_SITE_OTHER): Payer: Self-pay | Admitting: Internal Medicine

## 2019-07-15 ENCOUNTER — Other Ambulatory Visit: Payer: Self-pay

## 2019-07-15 ENCOUNTER — Ambulatory Visit (HOSPITAL_COMMUNITY)
Admission: RE | Admit: 2019-07-15 | Discharge: 2019-07-15 | Disposition: A | Discharge status: Home / Self Care | Payer: Medicare HMO | Source: Ambulatory Visit | Attending: Internal Medicine | Admitting: Internal Medicine

## 2019-07-15 DIAGNOSIS — K862 Cyst of pancreas: Secondary | ICD-10-CM

## 2019-07-15 DIAGNOSIS — R935 Abnormal findings on diagnostic imaging of other abdominal regions, including retroperitoneum: Secondary | ICD-10-CM | POA: Diagnosis not present

## 2019-07-15 MED ORDER — GADOBUTROL 1 MMOL/ML IV SOLN
5.0000 mL | Freq: Once | INTRAVENOUS | Status: AC | PRN
  Administered 2019-07-15: 5 mL via INTRAVENOUS

## 2019-07-21 DIAGNOSIS — S52032D Displaced fracture of olecranon process with intraarticular extension of left ulna, subsequent encounter for closed fracture with routine healing: Secondary | ICD-10-CM | POA: Diagnosis not present

## 2019-07-21 DIAGNOSIS — M25622 Stiffness of left elbow, not elsewhere classified: Secondary | ICD-10-CM | POA: Diagnosis not present

## 2019-07-22 DIAGNOSIS — M25522 Pain in left elbow: Secondary | ICD-10-CM | POA: Diagnosis not present

## 2019-07-28 DIAGNOSIS — M25522 Pain in left elbow: Secondary | ICD-10-CM | POA: Diagnosis not present

## 2019-08-04 DIAGNOSIS — M25522 Pain in left elbow: Secondary | ICD-10-CM | POA: Diagnosis not present

## 2019-08-12 DIAGNOSIS — M25522 Pain in left elbow: Secondary | ICD-10-CM | POA: Diagnosis not present

## 2019-08-12 DIAGNOSIS — S52032A Displaced fracture of olecranon process with intraarticular extension of left ulna, initial encounter for closed fracture: Secondary | ICD-10-CM | POA: Diagnosis not present

## 2019-08-17 DIAGNOSIS — Z6821 Body mass index (BMI) 21.0-21.9, adult: Secondary | ICD-10-CM | POA: Diagnosis not present

## 2019-08-17 DIAGNOSIS — R42 Dizziness and giddiness: Secondary | ICD-10-CM | POA: Diagnosis not present

## 2019-08-17 DIAGNOSIS — R7309 Other abnormal glucose: Secondary | ICD-10-CM | POA: Diagnosis not present

## 2019-08-17 DIAGNOSIS — E039 Hypothyroidism, unspecified: Secondary | ICD-10-CM | POA: Diagnosis not present

## 2019-08-17 DIAGNOSIS — E7849 Other hyperlipidemia: Secondary | ICD-10-CM | POA: Diagnosis not present

## 2019-08-17 DIAGNOSIS — S6290XS Unspecified fracture of unspecified wrist and hand, sequela: Secondary | ICD-10-CM | POA: Diagnosis not present

## 2019-08-17 DIAGNOSIS — Z0001 Encounter for general adult medical examination with abnormal findings: Secondary | ICD-10-CM | POA: Diagnosis not present

## 2019-08-17 DIAGNOSIS — K8689 Other specified diseases of pancreas: Secondary | ICD-10-CM | POA: Diagnosis not present

## 2019-08-17 DIAGNOSIS — E559 Vitamin D deficiency, unspecified: Secondary | ICD-10-CM | POA: Diagnosis not present

## 2019-08-17 DIAGNOSIS — Z1389 Encounter for screening for other disorder: Secondary | ICD-10-CM | POA: Diagnosis not present

## 2019-08-20 DIAGNOSIS — S52032D Displaced fracture of olecranon process with intraarticular extension of left ulna, subsequent encounter for closed fracture with routine healing: Secondary | ICD-10-CM | POA: Diagnosis not present

## 2019-08-20 DIAGNOSIS — M25622 Stiffness of left elbow, not elsewhere classified: Secondary | ICD-10-CM | POA: Diagnosis not present

## 2019-08-26 DIAGNOSIS — E063 Autoimmune thyroiditis: Secondary | ICD-10-CM | POA: Diagnosis not present

## 2019-08-26 DIAGNOSIS — E039 Hypothyroidism, unspecified: Secondary | ICD-10-CM | POA: Diagnosis not present

## 2019-08-28 DIAGNOSIS — E039 Hypothyroidism, unspecified: Secondary | ICD-10-CM | POA: Diagnosis not present

## 2019-09-09 DIAGNOSIS — M25522 Pain in left elbow: Secondary | ICD-10-CM | POA: Diagnosis not present

## 2019-09-18 DIAGNOSIS — M1991 Primary osteoarthritis, unspecified site: Secondary | ICD-10-CM | POA: Diagnosis not present

## 2019-09-18 DIAGNOSIS — M25511 Pain in right shoulder: Secondary | ICD-10-CM | POA: Diagnosis not present

## 2019-09-18 DIAGNOSIS — Z6821 Body mass index (BMI) 21.0-21.9, adult: Secondary | ICD-10-CM | POA: Diagnosis not present

## 2019-09-20 DIAGNOSIS — S52032D Displaced fracture of olecranon process with intraarticular extension of left ulna, subsequent encounter for closed fracture with routine healing: Secondary | ICD-10-CM | POA: Diagnosis not present

## 2019-09-20 DIAGNOSIS — M25622 Stiffness of left elbow, not elsewhere classified: Secondary | ICD-10-CM | POA: Diagnosis not present

## 2019-09-23 DIAGNOSIS — M25522 Pain in left elbow: Secondary | ICD-10-CM | POA: Diagnosis not present

## 2019-09-30 DIAGNOSIS — R69 Illness, unspecified: Secondary | ICD-10-CM | POA: Diagnosis not present

## 2019-10-16 DIAGNOSIS — M25522 Pain in left elbow: Secondary | ICD-10-CM | POA: Diagnosis not present

## 2019-10-26 DIAGNOSIS — E038 Other specified hypothyroidism: Secondary | ICD-10-CM | POA: Diagnosis not present

## 2019-10-26 DIAGNOSIS — E063 Autoimmune thyroiditis: Secondary | ICD-10-CM | POA: Diagnosis not present

## 2019-10-28 DIAGNOSIS — Z23 Encounter for immunization: Secondary | ICD-10-CM | POA: Diagnosis not present

## 2019-11-18 DIAGNOSIS — M25511 Pain in right shoulder: Secondary | ICD-10-CM | POA: Diagnosis not present

## 2019-11-18 DIAGNOSIS — M25522 Pain in left elbow: Secondary | ICD-10-CM | POA: Diagnosis not present

## 2019-11-18 DIAGNOSIS — S52032A Displaced fracture of olecranon process with intraarticular extension of left ulna, initial encounter for closed fracture: Secondary | ICD-10-CM | POA: Diagnosis not present

## 2019-11-26 DIAGNOSIS — T8484XA Pain due to internal orthopedic prosthetic devices, implants and grafts, initial encounter: Secondary | ICD-10-CM | POA: Diagnosis not present

## 2019-11-26 DIAGNOSIS — Z472 Encounter for removal of internal fixation device: Secondary | ICD-10-CM | POA: Diagnosis not present

## 2019-12-03 ENCOUNTER — Ambulatory Visit: Payer: Medicare HMO | Attending: Internal Medicine

## 2019-12-03 DIAGNOSIS — Z23 Encounter for immunization: Secondary | ICD-10-CM

## 2019-12-03 NOTE — Progress Notes (Signed)
   Covid-19 Vaccination Clinic  Name:  Kristin [name]    MRN: [id number] DOB: 12-Apr-1950  12/03/2019  [name] was observed post Covid-19 immunization for 30 minutes based on pre-vaccination screening without incident. She was provided with Vaccine Information Sheet and instruction to access the V-Safe system.   [name] was instructed to call 911 with any severe reactions post vaccine: Marland Kitchen Difficulty breathing  . Swelling of face and throat  . A fast heartbeat  . A bad rash all over body  . Dizziness and weakness

## 2019-12-08 DIAGNOSIS — M25522 Pain in left elbow: Secondary | ICD-10-CM | POA: Diagnosis not present

## 2019-12-08 DIAGNOSIS — Z4789 Encounter for other orthopedic aftercare: Secondary | ICD-10-CM | POA: Diagnosis not present

## 2019-12-08 DIAGNOSIS — M25622 Stiffness of left elbow, not elsewhere classified: Secondary | ICD-10-CM | POA: Diagnosis not present

## 2019-12-08 DIAGNOSIS — S52032A Displaced fracture of olecranon process with intraarticular extension of left ulna, initial encounter for closed fracture: Secondary | ICD-10-CM | POA: Diagnosis not present

## 2019-12-10 DIAGNOSIS — M25622 Stiffness of left elbow, not elsewhere classified: Secondary | ICD-10-CM | POA: Diagnosis not present

## 2019-12-15 DIAGNOSIS — M25622 Stiffness of left elbow, not elsewhere classified: Secondary | ICD-10-CM | POA: Diagnosis not present

## 2019-12-22 DIAGNOSIS — H04123 Dry eye syndrome of bilateral lacrimal glands: Secondary | ICD-10-CM | POA: Diagnosis not present

## 2019-12-22 DIAGNOSIS — H524 Presbyopia: Secondary | ICD-10-CM | POA: Diagnosis not present

## 2019-12-22 DIAGNOSIS — H40033 Anatomical narrow angle, bilateral: Secondary | ICD-10-CM | POA: Diagnosis not present

## 2019-12-22 DIAGNOSIS — H2513 Age-related nuclear cataract, bilateral: Secondary | ICD-10-CM | POA: Diagnosis not present

## 2019-12-22 DIAGNOSIS — H40013 Open angle with borderline findings, low risk, bilateral: Secondary | ICD-10-CM | POA: Diagnosis not present

## 2019-12-23 DIAGNOSIS — M25622 Stiffness of left elbow, not elsewhere classified: Secondary | ICD-10-CM | POA: Diagnosis not present

## 2019-12-28 DIAGNOSIS — M25622 Stiffness of left elbow, not elsewhere classified: Secondary | ICD-10-CM | POA: Diagnosis not present

## 2020-01-05 DIAGNOSIS — S52032A Displaced fracture of olecranon process with intraarticular extension of left ulna, initial encounter for closed fracture: Secondary | ICD-10-CM | POA: Diagnosis not present

## 2020-01-05 DIAGNOSIS — Z4789 Encounter for other orthopedic aftercare: Secondary | ICD-10-CM | POA: Diagnosis not present

## 2020-01-05 DIAGNOSIS — M25522 Pain in left elbow: Secondary | ICD-10-CM | POA: Diagnosis not present

## 2020-01-14 DIAGNOSIS — Z01419 Encounter for gynecological examination (general) (routine) without abnormal findings: Secondary | ICD-10-CM | POA: Diagnosis not present

## 2020-01-14 DIAGNOSIS — M810 Age-related osteoporosis without current pathological fracture: Secondary | ICD-10-CM | POA: Diagnosis not present

## 2020-01-14 DIAGNOSIS — Z1231 Encounter for screening mammogram for malignant neoplasm of breast: Secondary | ICD-10-CM | POA: Diagnosis not present

## 2020-01-14 DIAGNOSIS — Z6823 Body mass index (BMI) 23.0-23.9, adult: Secondary | ICD-10-CM | POA: Diagnosis not present

## 2020-01-14 DIAGNOSIS — Z1211 Encounter for screening for malignant neoplasm of colon: Secondary | ICD-10-CM | POA: Diagnosis not present

## 2020-02-22 DIAGNOSIS — E063 Autoimmune thyroiditis: Secondary | ICD-10-CM | POA: Diagnosis not present

## 2020-02-22 DIAGNOSIS — E039 Hypothyroidism, unspecified: Secondary | ICD-10-CM | POA: Diagnosis not present

## 2020-02-22 DIAGNOSIS — E038 Other specified hypothyroidism: Secondary | ICD-10-CM | POA: Diagnosis not present

## 2020-02-26 DIAGNOSIS — E038 Other specified hypothyroidism: Secondary | ICD-10-CM | POA: Diagnosis not present

## 2020-02-26 DIAGNOSIS — E063 Autoimmune thyroiditis: Secondary | ICD-10-CM | POA: Diagnosis not present

## 2020-03-18 DIAGNOSIS — E039 Hypothyroidism, unspecified: Secondary | ICD-10-CM | POA: Diagnosis not present

## 2020-03-18 DIAGNOSIS — Z6822 Body mass index (BMI) 22.0-22.9, adult: Secondary | ICD-10-CM | POA: Diagnosis not present

## 2020-03-18 DIAGNOSIS — K219 Gastro-esophageal reflux disease without esophagitis: Secondary | ICD-10-CM | POA: Diagnosis not present

## 2020-03-18 DIAGNOSIS — Z1331 Encounter for screening for depression: Secondary | ICD-10-CM | POA: Diagnosis not present

## 2020-03-30 DIAGNOSIS — S52032D Displaced fracture of olecranon process with intraarticular extension of left ulna, subsequent encounter for closed fracture with routine healing: Secondary | ICD-10-CM | POA: Diagnosis not present

## 2020-05-05 DIAGNOSIS — K0889 Other specified disorders of teeth and supporting structures: Secondary | ICD-10-CM | POA: Diagnosis not present

## 2020-05-05 DIAGNOSIS — Z6823 Body mass index (BMI) 23.0-23.9, adult: Secondary | ICD-10-CM | POA: Diagnosis not present

## 2020-07-05 DIAGNOSIS — E063 Autoimmune thyroiditis: Secondary | ICD-10-CM | POA: Diagnosis not present

## 2020-07-05 DIAGNOSIS — E039 Hypothyroidism, unspecified: Secondary | ICD-10-CM | POA: Diagnosis not present

## 2020-07-05 DIAGNOSIS — E038 Other specified hypothyroidism: Secondary | ICD-10-CM | POA: Diagnosis not present

## 2020-07-06 DIAGNOSIS — E063 Autoimmune thyroiditis: Secondary | ICD-10-CM | POA: Diagnosis not present

## 2020-07-06 DIAGNOSIS — E038 Other specified hypothyroidism: Secondary | ICD-10-CM | POA: Diagnosis not present

## 2020-07-25 ENCOUNTER — Ambulatory Visit (INDEPENDENT_AMBULATORY_CARE_PROVIDER_SITE_OTHER): Payer: Medicare HMO | Admitting: Gastroenterology

## 2020-07-25 DIAGNOSIS — M26622 Arthralgia of left temporomandibular joint: Secondary | ICD-10-CM | POA: Diagnosis not present

## 2020-07-25 DIAGNOSIS — Z6823 Body mass index (BMI) 23.0-23.9, adult: Secondary | ICD-10-CM | POA: Diagnosis not present

## 2020-07-27 DIAGNOSIS — M25522 Pain in left elbow: Secondary | ICD-10-CM | POA: Diagnosis not present

## 2020-07-27 DIAGNOSIS — M7711 Lateral epicondylitis, right elbow: Secondary | ICD-10-CM | POA: Diagnosis not present

## 2020-07-27 DIAGNOSIS — S52032A Displaced fracture of olecranon process with intraarticular extension of left ulna, initial encounter for closed fracture: Secondary | ICD-10-CM | POA: Diagnosis not present

## 2020-09-12 DIAGNOSIS — Z1389 Encounter for screening for other disorder: Secondary | ICD-10-CM | POA: Diagnosis not present

## 2020-09-12 DIAGNOSIS — Z0001 Encounter for general adult medical examination with abnormal findings: Secondary | ICD-10-CM | POA: Diagnosis not present

## 2020-09-12 DIAGNOSIS — R7309 Other abnormal glucose: Secondary | ICD-10-CM | POA: Diagnosis not present

## 2020-09-12 DIAGNOSIS — Z1331 Encounter for screening for depression: Secondary | ICD-10-CM | POA: Diagnosis not present

## 2020-09-12 DIAGNOSIS — M1991 Primary osteoarthritis, unspecified site: Secondary | ICD-10-CM | POA: Diagnosis not present

## 2020-09-12 DIAGNOSIS — Z6823 Body mass index (BMI) 23.0-23.9, adult: Secondary | ICD-10-CM | POA: Diagnosis not present

## 2020-09-12 DIAGNOSIS — E7849 Other hyperlipidemia: Secondary | ICD-10-CM | POA: Diagnosis not present

## 2020-09-12 DIAGNOSIS — E782 Mixed hyperlipidemia: Secondary | ICD-10-CM | POA: Diagnosis not present

## 2020-09-12 DIAGNOSIS — E039 Hypothyroidism, unspecified: Secondary | ICD-10-CM | POA: Diagnosis not present

## 2020-09-21 DIAGNOSIS — S52032A Displaced fracture of olecranon process with intraarticular extension of left ulna, initial encounter for closed fracture: Secondary | ICD-10-CM | POA: Diagnosis not present

## 2020-09-21 DIAGNOSIS — M7711 Lateral epicondylitis, right elbow: Secondary | ICD-10-CM | POA: Diagnosis not present

## 2020-09-21 DIAGNOSIS — M25522 Pain in left elbow: Secondary | ICD-10-CM | POA: Diagnosis not present

## 2020-09-21 DIAGNOSIS — M25521 Pain in right elbow: Secondary | ICD-10-CM | POA: Diagnosis not present

## 2020-11-14 ENCOUNTER — Other Ambulatory Visit: Payer: Self-pay

## 2020-11-14 ENCOUNTER — Encounter (INDEPENDENT_AMBULATORY_CARE_PROVIDER_SITE_OTHER): Payer: Self-pay | Admitting: Gastroenterology

## 2020-11-14 ENCOUNTER — Ambulatory Visit (INDEPENDENT_AMBULATORY_CARE_PROVIDER_SITE_OTHER): Payer: Medicare HMO | Admitting: Gastroenterology

## 2020-11-14 VITALS — BP 130/79 | HR 67 | Temp 97.9°F | Ht 61.5 in | Wt 121.8 lb

## 2020-11-14 DIAGNOSIS — K862 Cyst of pancreas: Secondary | ICD-10-CM | POA: Diagnosis not present

## 2020-11-14 NOTE — Patient Instructions (Signed)
Schedule MRI abdomen/pelvis with IV contrast

## 2020-11-14 NOTE — Progress Notes (Signed)
Kristin [name], M.D. Gastroenterology & Hepatology [facility] [address] [place], [place] [address]  Primary Care Physician: Sharilyn Sites, MD [address] [place] [address]  I will communicate my assessment and recommendations to the referring MD via EMR.  Problems: Pancreatic cystic lesion  History of Present Illness: Kristin [name] is a 70 y.o. female with past medical history of atherosclerosis, hypothyroidism, osteoporosis, OSA and who presents for follow up of pancreatic cystic lesion.  The patient was last seen on 07/16/2018.   The patient has presented pancreatic lesion with cystic appearance at least since 2013.  Her case was discussed with Dr. Ardis Hughs who considered that given that the lesion has been stable and did not have criteria for invasive testing, he recommended clinical surveillance or repeating abdominal imaging.  She underwent an MRI of the abdomen and without IV contrast on 07/15/2019 which showed a stable 2.8 x 2.6 cm multicystic lesion in the head of the pancreas without main duct dilation which was very similar to a PET scan from 2013 measuring 2.2 x 2.1 cm.  Consideration for follow-up MRI was recommended.  The patient denies having any nausea, vomiting, fever, chills, hematochezia, melena, hematemesis, abdominal distention, abdominal pain, diarrhea, jaundice, pruritus or weight loss.  No prediabetes.  Last Colonoscopy: last performed in 2020, normal per the patient, no report available.  Has it performed by Dr. Collene Mares every 3 years as father and brother had colon cancer  Past Medical History: Past Medical History:  Diagnosis Date   Aortic atherosclerosis ([place]) 0/09/8117   Complication of anesthesia    long time to wake up 2018 with same surgery   Coronary atherosclerosis 09/30/2016   By CT findings   Dizziness    Hypothyroidism    Lumbar degenerative disc disease 09/30/2016   Maxillary sinusitis     Osteoporosis    PONV (postoperative nausea and vomiting)    Renal disorder    Sleep apnea    mild osa, does not use CPAP   Thyroid disease     Past Surgical History: Past Surgical History:  Procedure Laterality Date   BACK SURGERY  1997/1999   CARPAL TUNNEL RELEASE Right    CYSTECTOMY  1972   brreast   MAXILLARY ANTROSTOMY Left 08/09/2014   Procedure: ENDOSCOPIC MAXILLARY ANTROSTOMY;  Surgeon: Leta Baptist, MD;  Location: [facility];  Service: ENT;  Laterality: Left;   MAXILLARY ANTROSTOMY Left 10/29/2017   Procedure: LEFT MAXILLARY ANTROSTOMY WITH TISSUE REMOVAL;  Surgeon: Leta Baptist, MD;  Location: [facility];  Service: ENT;  Laterality: Left;   NASAL SINUS SURGERY Left 08/09/2014   Procedure: ENDOSCOPIC SINUS SURGERY;  Surgeon: Leta Baptist, MD;  Location: [facility];  Service: ENT;  Laterality: Left;   TUBAL LIGATION  1980    Family History: Family History  Problem Relation Age of Onset   Colon cancer Father    Stroke Paternal Grandfather    Cirrhosis Maternal Grandmother        liver   Hypertension Mother     Social History: Social History   Tobacco Use  Smoking Status Former   Years: 3.00   Types: Cigarettes   Quit date: 01/30/1971   Years since quitting: 49.8  Smokeless Tobacco Never   Social History   Substance and Sexual Activity  Alcohol Use No   Social History   Substance and Sexual Activity  Drug Use No    Allergies: Allergies  Allergen  Reactions   Bee Venom Anaphylaxis   Penicillins Swelling    Has patient had a PCN reaction causing immediate rash, facial/tongue/throat swelling, SOB or lightheadedness with hypotension: No Has patient had a PCN reaction causing severe rash involving mucus membranes or skin necrosis: No Has patient had a PCN reaction that required hospitalization: No Has patient had a PCN reaction occurring within the last 10 years: No If all of the above answers are "NO", then may proceed  with Cephalosporin use.      Medications: Current Outpatient Medications  Medication Sig Dispense Refill   alendronate (FOSAMAX) 70 MG tablet Take 70 mg by mouth once a week. Take with a full glass of water on an empty stomach.     calcium gluconate 500 MG tablet Take 1 tablet by mouth daily.     Cholecalciferol (VITAMIN D) 2000 UNITS tablet Take 2,000 Units by mouth every evening.     co-enzyme Q-10 30 MG capsule Take 30 mg by mouth daily.     Levothyroxine Sodium 88 MCG CAPS Take 88 mcg by mouth daily before breakfast.     meclizine (ANTIVERT) 25 MG tablet Take 25 mg by mouth 3 (three) times daily as needed for dizziness.     rosuvastatin (CRESTOR) 40 MG tablet Take 40 mg by mouth daily.     No current facility-administered medications for this visit.    Review of Systems: GENERAL: negative for malaise, night sweats HEENT: No changes in hearing or vision, no nose bleeds or other nasal problems. NECK: Negative for lumps, goiter, pain and significant neck swelling RESPIRATORY: Negative for cough, wheezing CARDIOVASCULAR: Negative for chest pain, leg swelling, palpitations, orthopnea GI: SEE HPI MUSCULOSKELETAL: Negative for joint pain or swelling, back pain, and muscle pain. SKIN: Negative for lesions, rash PSYCH: Negative for sleep disturbance, mood disorder and recent psychosocial stressors. HEMATOLOGY Negative for prolonged bleeding, bruising easily, and swollen nodes. ENDOCRINE: Negative for cold or heat intolerance, polyuria, polydipsia and goiter. NEURO: negative for tremor, gait imbalance, syncope and seizures. The remainder of the review of systems is noncontributory.   Physical Exam: BP 130/79 (BP Location: Left Arm, Patient Position: Sitting, Cuff Size: Small)   Pulse 67   Temp 97.9 F (36.6 C) (Oral)   Ht 5' 1.5" (1.562 m)   Wt 121 lb 12.8 oz (55.2 kg)   BMI 22.64 kg/m  GENERAL: The patient is AO x3, in no acute distress. HEENT: Head is normocephalic and  atraumatic. EOMI are intact. Mouth is well hydrated and without lesions. NECK: Supple. No masses LUNGS: Clear to auscultation. No presence of rhonchi/wheezing/rales. Adequate chest expansion HEART: RRR, normal s1 and s2. ABDOMEN: Soft, nontender, no guarding, no peritoneal signs, and nondistended. BS +. No masses. EXTREMITIES: Without any cyanosis, clubbing, rash, lesions or edema. NEUROLOGIC: AOx3, no focal motor deficit. SKIN: no jaundice, no rashes  Imaging/Labs: as above  I personally reviewed and interpreted the available labs, imaging and endoscopic files.  Impression and Plan: KALEYAH LABRECK is a 70 y.o. female with past medical history of atherosclerosis, hypothyroidism, osteoporosis, OSA and who presents for follow up of pancreatic cystic lesion.  The patient has presented a relatively stable cyst as size of this lesion has not significantly changed in the last 8 years based on imaging surveillance and has not exhibited any high risk features.  She has been asymptomatic and has not been diagnosed with prediabetes (it is unclear if the borderline values of her glucose were performed when she was fasting back  in 2018).  I had a thorough discussion with the patient regarding her low risk of having progression to malignancy as her pancreatic lesion has been stable through the years.  We will repeat one last MRI for surveillance but she was given reassurance.  I advised her that if she were to have any appetite changes, nausea, abdominal pain, weight loss or develops prediabetes, she should let us know to repeat an MRI of the abdomen if the one will order today comes back normal. Patient understood and agreed.  - Schedule MRI abdomen/pelvis with IV contrast  All questions were answered.      Harvel Quale, MD Gastroenterology and Hepatology [facility]

## 2020-11-30 DIAGNOSIS — E782 Mixed hyperlipidemia: Secondary | ICD-10-CM | POA: Diagnosis not present

## 2020-11-30 DIAGNOSIS — R7309 Other abnormal glucose: Secondary | ICD-10-CM | POA: Diagnosis not present

## 2020-11-30 DIAGNOSIS — Z6823 Body mass index (BMI) 23.0-23.9, adult: Secondary | ICD-10-CM | POA: Diagnosis not present

## 2020-11-30 DIAGNOSIS — K219 Gastro-esophageal reflux disease without esophagitis: Secondary | ICD-10-CM | POA: Diagnosis not present

## 2020-11-30 DIAGNOSIS — M1991 Primary osteoarthritis, unspecified site: Secondary | ICD-10-CM | POA: Diagnosis not present

## 2020-11-30 DIAGNOSIS — E7849 Other hyperlipidemia: Secondary | ICD-10-CM | POA: Diagnosis not present

## 2020-11-30 DIAGNOSIS — E039 Hypothyroidism, unspecified: Secondary | ICD-10-CM | POA: Diagnosis not present

## 2020-12-02 ENCOUNTER — Ambulatory Visit (HOSPITAL_COMMUNITY)
Admission: RE | Admit: 2020-12-02 | Discharge: 2020-12-02 | Disposition: A | Discharge status: Home / Self Care | Payer: Medicare HMO | Source: Ambulatory Visit | Attending: Gastroenterology | Admitting: Gastroenterology

## 2020-12-02 ENCOUNTER — Other Ambulatory Visit: Payer: Self-pay

## 2020-12-02 ENCOUNTER — Other Ambulatory Visit (INDEPENDENT_AMBULATORY_CARE_PROVIDER_SITE_OTHER): Payer: Self-pay | Admitting: Gastroenterology

## 2020-12-02 DIAGNOSIS — K862 Cyst of pancreas: Secondary | ICD-10-CM | POA: Insufficient documentation

## 2020-12-02 DIAGNOSIS — K7689 Other specified diseases of liver: Secondary | ICD-10-CM | POA: Diagnosis not present

## 2020-12-02 MED ORDER — GADOBUTROL 1 MMOL/ML IV SOLN
6.0000 mL | Freq: Once | INTRAVENOUS | Status: AC | PRN
  Administered 2020-12-02: 6 mL via INTRAVENOUS

## 2020-12-26 DIAGNOSIS — H35361 Drusen (degenerative) of macula, right eye: Secondary | ICD-10-CM | POA: Diagnosis not present

## 2020-12-26 DIAGNOSIS — D3131 Benign neoplasm of right choroid: Secondary | ICD-10-CM | POA: Diagnosis not present

## 2020-12-26 DIAGNOSIS — H524 Presbyopia: Secondary | ICD-10-CM | POA: Diagnosis not present

## 2020-12-26 DIAGNOSIS — H2513 Age-related nuclear cataract, bilateral: Secondary | ICD-10-CM | POA: Diagnosis not present

## 2020-12-26 DIAGNOSIS — H40013 Open angle with borderline findings, low risk, bilateral: Secondary | ICD-10-CM | POA: Diagnosis not present

## 2020-12-26 DIAGNOSIS — H40033 Anatomical narrow angle, bilateral: Secondary | ICD-10-CM | POA: Diagnosis not present

## 2020-12-30 DIAGNOSIS — E038 Other specified hypothyroidism: Secondary | ICD-10-CM | POA: Diagnosis not present

## 2020-12-30 DIAGNOSIS — E063 Autoimmune thyroiditis: Secondary | ICD-10-CM | POA: Diagnosis not present

## 2021-01-06 DIAGNOSIS — Z79899 Other long term (current) drug therapy: Secondary | ICD-10-CM | POA: Diagnosis not present

## 2021-01-06 DIAGNOSIS — E038 Other specified hypothyroidism: Secondary | ICD-10-CM | POA: Diagnosis not present

## 2021-01-06 DIAGNOSIS — E063 Autoimmune thyroiditis: Secondary | ICD-10-CM | POA: Diagnosis not present

## 2021-02-01 DIAGNOSIS — Z124 Encounter for screening for malignant neoplasm of cervix: Secondary | ICD-10-CM | POA: Diagnosis not present

## 2021-02-01 DIAGNOSIS — M810 Age-related osteoporosis without current pathological fracture: Secondary | ICD-10-CM | POA: Diagnosis not present

## 2021-02-01 DIAGNOSIS — Z01419 Encounter for gynecological examination (general) (routine) without abnormal findings: Secondary | ICD-10-CM | POA: Diagnosis not present

## 2021-02-01 DIAGNOSIS — Z1211 Encounter for screening for malignant neoplasm of colon: Secondary | ICD-10-CM | POA: Diagnosis not present

## 2021-02-01 DIAGNOSIS — Z1231 Encounter for screening mammogram for malignant neoplasm of breast: Secondary | ICD-10-CM | POA: Diagnosis not present

## 2021-02-02 DIAGNOSIS — Z1231 Encounter for screening mammogram for malignant neoplasm of breast: Secondary | ICD-10-CM | POA: Diagnosis not present

## 2021-03-01 ENCOUNTER — Other Ambulatory Visit: Payer: Self-pay | Admitting: Obstetrics and Gynecology

## 2021-03-01 DIAGNOSIS — M810 Age-related osteoporosis without current pathological fracture: Secondary | ICD-10-CM

## 2021-03-29 ENCOUNTER — Other Ambulatory Visit: Payer: Self-pay

## 2021-03-29 ENCOUNTER — Other Ambulatory Visit (HOSPITAL_COMMUNITY): Payer: Self-pay | Admitting: Family Medicine

## 2021-03-29 ENCOUNTER — Ambulatory Visit (HOSPITAL_COMMUNITY)
Admission: RE | Admit: 2021-03-29 | Discharge: 2021-03-29 | Disposition: A | Discharge status: Home / Self Care | Payer: Medicare HMO | Source: Ambulatory Visit | Attending: Family Medicine | Admitting: Family Medicine

## 2021-03-29 DIAGNOSIS — J157 Pneumonia due to Mycoplasma pneumoniae: Secondary | ICD-10-CM

## 2021-03-29 DIAGNOSIS — R059 Cough, unspecified: Secondary | ICD-10-CM | POA: Insufficient documentation

## 2021-03-29 DIAGNOSIS — J189 Pneumonia, unspecified organism: Secondary | ICD-10-CM | POA: Diagnosis not present

## 2021-04-01 ENCOUNTER — Ambulatory Visit
Admission: EM | Admit: 2021-04-01 | Discharge: 2021-04-01 | Disposition: A | Discharge status: Home / Self Care | Payer: Medicare HMO

## 2021-04-01 ENCOUNTER — Other Ambulatory Visit: Payer: Self-pay

## 2021-04-01 ENCOUNTER — Encounter: Payer: Self-pay | Admitting: Emergency Medicine

## 2021-04-01 DIAGNOSIS — U071 COVID-19: Secondary | ICD-10-CM | POA: Diagnosis not present

## 2021-04-01 NOTE — ED Provider Notes (Signed)
?[facility] ? ? ? ?CSN: [id number] ?Arrival date & time: 04/01/21  1009 ? ? ?  ? ?History   ?Chief Complaint ?No chief complaint on file. ? ? ?HPI ?Kristin [name] is a 71 y.o. female.  ? ?71 year old female presents today with cough, congestion.  Symptoms started on Tuesday.  Initially tested negative for COVID but most recently tested positive.  Currently taking azithromycin prescribed by her primary care.  Feels that her symptoms are improving.  She is still taking Tessalon Perles for cough.  No fever, chills, chest pain or shortness of breath. ? ? ? ?Past Medical History:  ?Diagnosis Date  ? Aortic atherosclerosis ([place]) 09/30/2016  ? Complication of anesthesia   ? long time to wake up 2018 with same surgery  ? Coronary atherosclerosis 09/30/2016  ? By CT findings  ? Dizziness   ? Hypothyroidism   ? Lumbar degenerative disc disease 09/30/2016  ? Maxillary sinusitis   ? Osteoporosis   ? PONV (postoperative nausea and vomiting)   ? Renal disorder   ? Sleep apnea   ? mild osa, does not use CPAP  ? Thyroid disease   ? ? ?Patient Active Problem List  ? Diagnosis Date Noted  ? Pancreatic cyst 11/14/2020  ? Left flank pain 09/30/2016  ? LLQ pain 09/30/2016  ? Bilateral nephrolithiasis 09/30/2016  ? Lumbar degenerative disc disease 09/30/2016  ? Aortic atherosclerosis () 09/30/2016  ? Coronary atherosclerosis 09/30/2016  ? Pulmonary nodule 12/06/2011  ? ? ?Past Surgical History:  ?Procedure Laterality Date  ? BACK SURGERY  1997/1999  ? CARPAL TUNNEL RELEASE Right   ? CYSTECTOMY  1972  ? brreast  ? MAXILLARY ANTROSTOMY Left 08/09/2014  ? Procedure: ENDOSCOPIC MAXILLARY ANTROSTOMY;  Surgeon: Leta Baptist, MD;  Location: [facility];  Service: ENT;  Laterality: Left;  ? MAXILLARY ANTROSTOMY Left 10/29/2017  ? Procedure: LEFT MAXILLARY ANTROSTOMY WITH TISSUE REMOVAL;  Surgeon: Leta Baptist, MD;  Location: [facility];  Service: ENT;  Laterality: Left;  ? NASAL SINUS SURGERY Left 08/09/2014  ? Procedure:  ENDOSCOPIC SINUS SURGERY;  Surgeon: Leta Baptist, MD;  Location: [facility];  Service: ENT;  Laterality: Left;  ? [place]  ? ? ?OB History   ? ? Gravida  ?1  ? Para  ?1  ? Term  ?0  ? Preterm  ?0  ? AB  ?0  ? Living  ?0  ?  ? ? SAB  ?0  ? IAB  ?0  ? Ectopic  ?0  ? Multiple  ?0  ? Live Births  ?   ?   ?  ?  ? ? ? ?Home Medications   ? ?Prior to Admission medications   ?Medication Sig Start Date End Date Taking? Authorizing Provider  ?alendronate (FOSAMAX) 70 MG tablet Take 70 mg by mouth once a week. Take with a full glass of water on an empty stomach.    [provider]  ?calcium gluconate 500 MG tablet Take 1 tablet by mouth daily.    [provider]  ?Cholecalciferol (VITAMIN D) 2000 UNITS tablet Take 2,000 Units by mouth every evening.    [provider]  ?co-enzyme Q-10 30 MG capsule Take 30 mg by mouth daily.    [provider]  ?Levothyroxine Sodium 88 MCG CAPS Take 88 mcg by mouth daily before breakfast.    [provider]  ?meclizine (ANTIVERT) 25 MG tablet Take 25 mg by mouth 3 (  three) times daily as needed for dizziness.    [provider]  ?rosuvastatin (CRESTOR) 40 MG tablet Take 40 mg by mouth daily.    [provider]  ? ? ?Family History ?Family History  ?Problem Relation Age of Onset  ? Colon cancer Father   ? Stroke Paternal Grandfather   ? Cirrhosis Maternal Grandmother   ?     liver  ? Hypertension Mother   ? ? ?Social History ?Social History  ? ?Tobacco Use  ? Smoking status: Former  ?  Years: 3.00  ?  Types: Cigarettes  ?  Quit date: 01/30/1971  ?  Years since quitting: 50.2  ? Smokeless tobacco: Never  ?Vaping Use  ? Vaping Use: Never used  ?Substance Use Topics  ? Alcohol use: No  ? Drug use: No  ? ? ? ?Allergies   ?Bee venom, Penicillins, and Sulfa antibiotics ? ? ?Review of Systems ?Review of Systems ?Per hpi ? ?Physical Exam ?Triage Vital Signs ?ED Triage Vitals  ?Enc Vitals Group  ?   BP 04/01/21 1054  138/82  ?   Pulse Rate 04/01/21 1054 89  ?   Resp 04/01/21 1054 18  ?   Temp 04/01/21 1054 98.3 ?F (36.8 ?C)  ?   Temp Source 04/01/21 1054 Oral  ?   SpO2 04/01/21 1054 94 %  ?   Weight --   ?   Height --   ?   Head Circumference --   ?   Peak Flow --   ?   Pain Score 04/01/21 1055 5  ?   Pain Loc --   ?   Pain Edu? --   ?   Excl. in [place]? --   ? ?No data found. ? ?Updated Vital Signs ?BP 138/82 (BP Location: Right Arm)   Pulse 89   Temp 98.3 ?F (36.8 ?C) (Oral)   Resp 18   SpO2 94%  ? ?Visual Acuity ?Right Eye Distance:   ?Left Eye Distance:   ?Bilateral Distance:   ? ?Right Eye Near:   ?Left Eye Near:    ?Bilateral Near:    ? ?Physical Exam ?Constitutional:   ?   General: She is not in acute distress. ?   Appearance: Normal appearance. She is not ill-appearing, toxic-appearing or diaphoretic.  ?HENT:  ?   Head: Normocephalic and atraumatic.  ?Cardiovascular:  ?   Rate and Rhythm: Normal rate and regular rhythm.  ?   Pulses: Normal pulses.  ?   Heart sounds: Normal heart sounds.  ?Pulmonary:  ?   Effort: Pulmonary effort is normal.  ?   Breath sounds: Normal breath sounds.  ?Skin: ?   General: Skin is warm and dry.  ?Neurological:  ?   Mental Status: She is alert.  ? ? ? ?UC Treatments / Results  ?Labs ?(all labs ordered are listed, but only abnormal results are displayed) ?Labs Reviewed - No data to display ? ?EKG ? ? ?Radiology ?No results found. ? ?Procedures ?Procedures (including critical care time) ? ?Medications Ordered in UC ?Medications - No data to display ? ?Initial Impression / Assessment and Plan / UC Course  ?I have reviewed the triage vital signs and the nursing notes. ? ?Pertinent labs & imaging results that were available during my care of the patient were reviewed by me and considered in my medical decision making (see chart for details). ? ?  ? ?COVID-19 ?Out of the window for antivirals at this time. ?Go ahead and  finish the azithromycin that you have already started and almost finished.   Continue cough medicine as needed ?Final Clinical Impressions(s) / UC Diagnoses  ? ?Final diagnoses:  ?COVID-19  ? ? ? ?Discharge Instructions   ? ?  ?Continue the cough medicine as needed and go ahead and finish out the Z-Pak since you are almost finished with it.  No need for antivirals today. ?Rest, hydrate ? ? ? ?ED Prescriptions   ?None ?  ? ?PDMP not reviewed this encounter. ?  ?Orvan July, NP ?04/01/21 1140 ? ?

## 2021-04-01 NOTE — ED Triage Notes (Signed)
Cough, body aches, headache, sore throat.  Husband and mother both tested positive for covid today.  Was seen on Wednesday by PCP and was placed on a z-pack and given cough medication. ?

## 2021-04-01 NOTE — Discharge Instructions (Signed)
Continue the cough medicine as needed and go ahead and finish out the Z-Pak since you are almost finished with it.  No need for antivirals today. ?Rest, hydrate ?

## 2021-05-25 DIAGNOSIS — M25531 Pain in right wrist: Secondary | ICD-10-CM | POA: Diagnosis not present

## 2021-05-25 DIAGNOSIS — M7711 Lateral epicondylitis, right elbow: Secondary | ICD-10-CM | POA: Diagnosis not present

## 2021-05-25 DIAGNOSIS — M65839 Other synovitis and tenosynovitis, unspecified forearm: Secondary | ICD-10-CM | POA: Diagnosis not present

## 2021-05-31 DIAGNOSIS — M65831 Other synovitis and tenosynovitis, right forearm: Secondary | ICD-10-CM | POA: Diagnosis not present

## 2021-06-07 DIAGNOSIS — E038 Other specified hypothyroidism: Secondary | ICD-10-CM | POA: Diagnosis not present

## 2021-06-07 DIAGNOSIS — E063 Autoimmune thyroiditis: Secondary | ICD-10-CM | POA: Diagnosis not present

## 2021-06-16 DIAGNOSIS — E038 Other specified hypothyroidism: Secondary | ICD-10-CM | POA: Diagnosis not present

## 2021-06-16 DIAGNOSIS — Z79890 Hormone replacement therapy (postmenopausal): Secondary | ICD-10-CM | POA: Diagnosis not present

## 2021-06-16 DIAGNOSIS — E063 Autoimmune thyroiditis: Secondary | ICD-10-CM | POA: Diagnosis not present

## 2021-06-19 DIAGNOSIS — M65831 Other synovitis and tenosynovitis, right forearm: Secondary | ICD-10-CM | POA: Diagnosis not present

## 2021-07-10 DIAGNOSIS — M25531 Pain in right wrist: Secondary | ICD-10-CM | POA: Diagnosis not present

## 2021-07-10 DIAGNOSIS — M1711 Unilateral primary osteoarthritis, right knee: Secondary | ICD-10-CM | POA: Diagnosis not present

## 2021-07-10 DIAGNOSIS — M25561 Pain in right knee: Secondary | ICD-10-CM | POA: Diagnosis not present

## 2021-07-10 DIAGNOSIS — M65839 Other synovitis and tenosynovitis, unspecified forearm: Secondary | ICD-10-CM | POA: Diagnosis not present

## 2021-07-18 DIAGNOSIS — Z6823 Body mass index (BMI) 23.0-23.9, adult: Secondary | ICD-10-CM | POA: Diagnosis not present

## 2021-07-18 DIAGNOSIS — M1991 Primary osteoarthritis, unspecified site: Secondary | ICD-10-CM | POA: Diagnosis not present

## 2021-07-18 DIAGNOSIS — E039 Hypothyroidism, unspecified: Secondary | ICD-10-CM | POA: Diagnosis not present

## 2021-07-18 DIAGNOSIS — E559 Vitamin D deficiency, unspecified: Secondary | ICD-10-CM | POA: Diagnosis not present

## 2021-07-18 DIAGNOSIS — E7849 Other hyperlipidemia: Secondary | ICD-10-CM | POA: Diagnosis not present

## 2021-07-18 DIAGNOSIS — R7309 Other abnormal glucose: Secondary | ICD-10-CM | POA: Diagnosis not present

## 2021-07-18 DIAGNOSIS — L659 Nonscarring hair loss, unspecified: Secondary | ICD-10-CM | POA: Diagnosis not present

## 2021-08-10 ENCOUNTER — Ambulatory Visit
Admission: RE | Admit: 2021-08-10 | Discharge: 2021-08-10 | Disposition: A | Discharge status: Home / Self Care | Payer: Medicare HMO | Source: Ambulatory Visit | Attending: Obstetrics and Gynecology | Admitting: Obstetrics and Gynecology

## 2021-08-10 DIAGNOSIS — Z780 Asymptomatic menopausal state: Secondary | ICD-10-CM | POA: Diagnosis not present

## 2021-08-10 DIAGNOSIS — M810 Age-related osteoporosis without current pathological fracture: Secondary | ICD-10-CM | POA: Diagnosis not present

## 2021-10-05 DIAGNOSIS — E782 Mixed hyperlipidemia: Secondary | ICD-10-CM | POA: Diagnosis not present

## 2021-10-05 DIAGNOSIS — M19011 Primary osteoarthritis, right shoulder: Secondary | ICD-10-CM | POA: Diagnosis not present

## 2021-10-05 DIAGNOSIS — Z1331 Encounter for screening for depression: Secondary | ICD-10-CM | POA: Diagnosis not present

## 2021-10-05 DIAGNOSIS — Z0001 Encounter for general adult medical examination with abnormal findings: Secondary | ICD-10-CM | POA: Diagnosis not present

## 2021-10-05 DIAGNOSIS — Z23 Encounter for immunization: Secondary | ICD-10-CM | POA: Diagnosis not present

## 2021-10-05 DIAGNOSIS — K219 Gastro-esophageal reflux disease without esophagitis: Secondary | ICD-10-CM | POA: Diagnosis not present

## 2021-10-05 DIAGNOSIS — Z6823 Body mass index (BMI) 23.0-23.9, adult: Secondary | ICD-10-CM | POA: Diagnosis not present

## 2021-10-05 DIAGNOSIS — E039 Hypothyroidism, unspecified: Secondary | ICD-10-CM | POA: Diagnosis not present

## 2021-10-09 DIAGNOSIS — M25511 Pain in right shoulder: Secondary | ICD-10-CM | POA: Diagnosis not present

## 2021-10-09 DIAGNOSIS — M65839 Other synovitis and tenosynovitis, unspecified forearm: Secondary | ICD-10-CM | POA: Diagnosis not present

## 2021-10-09 DIAGNOSIS — M25521 Pain in right elbow: Secondary | ICD-10-CM | POA: Diagnosis not present

## 2021-10-09 DIAGNOSIS — M25561 Pain in right knee: Secondary | ICD-10-CM | POA: Diagnosis not present

## 2021-10-09 DIAGNOSIS — M25531 Pain in right wrist: Secondary | ICD-10-CM | POA: Diagnosis not present

## 2021-10-09 DIAGNOSIS — M7711 Lateral epicondylitis, right elbow: Secondary | ICD-10-CM | POA: Diagnosis not present

## 2021-10-09 DIAGNOSIS — M1711 Unilateral primary osteoarthritis, right knee: Secondary | ICD-10-CM | POA: Diagnosis not present

## 2021-11-21 ENCOUNTER — Encounter (INDEPENDENT_AMBULATORY_CARE_PROVIDER_SITE_OTHER): Payer: Self-pay | Admitting: *Deleted

## 2021-11-27 DIAGNOSIS — M1711 Unilateral primary osteoarthritis, right knee: Secondary | ICD-10-CM | POA: Diagnosis not present

## 2021-11-27 DIAGNOSIS — M25521 Pain in right elbow: Secondary | ICD-10-CM | POA: Diagnosis not present

## 2021-11-27 DIAGNOSIS — M25531 Pain in right wrist: Secondary | ICD-10-CM | POA: Diagnosis not present

## 2021-11-27 DIAGNOSIS — M65839 Other synovitis and tenosynovitis, unspecified forearm: Secondary | ICD-10-CM | POA: Diagnosis not present

## 2021-11-27 DIAGNOSIS — M25522 Pain in left elbow: Secondary | ICD-10-CM | POA: Diagnosis not present

## 2021-11-27 DIAGNOSIS — M7541 Impingement syndrome of right shoulder: Secondary | ICD-10-CM | POA: Diagnosis not present

## 2021-11-27 DIAGNOSIS — M25561 Pain in right knee: Secondary | ICD-10-CM | POA: Diagnosis not present

## 2021-11-27 DIAGNOSIS — M7711 Lateral epicondylitis, right elbow: Secondary | ICD-10-CM | POA: Diagnosis not present

## 2021-11-27 DIAGNOSIS — M25511 Pain in right shoulder: Secondary | ICD-10-CM | POA: Diagnosis not present

## 2021-11-28 ENCOUNTER — Other Ambulatory Visit: Payer: Self-pay | Admitting: Gastroenterology

## 2021-11-28 DIAGNOSIS — Z1211 Encounter for screening for malignant neoplasm of colon: Secondary | ICD-10-CM | POA: Diagnosis not present

## 2021-11-28 DIAGNOSIS — E782 Mixed hyperlipidemia: Secondary | ICD-10-CM | POA: Diagnosis not present

## 2021-11-28 DIAGNOSIS — Z86010 Personal history of colonic polyps: Secondary | ICD-10-CM | POA: Diagnosis not present

## 2021-11-28 DIAGNOSIS — K8689 Other specified diseases of pancreas: Secondary | ICD-10-CM

## 2021-11-28 DIAGNOSIS — E039 Hypothyroidism, unspecified: Secondary | ICD-10-CM | POA: Diagnosis not present

## 2021-11-28 DIAGNOSIS — R194 Change in bowel habit: Secondary | ICD-10-CM | POA: Diagnosis not present

## 2021-11-30 ENCOUNTER — Ambulatory Visit (INDEPENDENT_AMBULATORY_CARE_PROVIDER_SITE_OTHER): Payer: Medicare HMO | Admitting: Gastroenterology

## 2021-12-11 DIAGNOSIS — Z1211 Encounter for screening for malignant neoplasm of colon: Secondary | ICD-10-CM | POA: Diagnosis not present

## 2021-12-11 DIAGNOSIS — Z86010 Personal history of colonic polyps: Secondary | ICD-10-CM | POA: Diagnosis not present

## 2021-12-11 DIAGNOSIS — K5730 Diverticulosis of large intestine without perforation or abscess without bleeding: Secondary | ICD-10-CM | POA: Diagnosis not present

## 2021-12-11 DIAGNOSIS — Z800 Family history of malignant neoplasm of digestive organs: Secondary | ICD-10-CM | POA: Diagnosis not present

## 2021-12-11 DIAGNOSIS — R194 Change in bowel habit: Secondary | ICD-10-CM | POA: Diagnosis not present

## 2021-12-13 ENCOUNTER — Ambulatory Visit (INDEPENDENT_AMBULATORY_CARE_PROVIDER_SITE_OTHER): Payer: Medicare HMO

## 2021-12-13 ENCOUNTER — Ambulatory Visit
Admission: EM | Admit: 2021-12-13 | Discharge: 2021-12-13 | Disposition: A | Discharge status: Home / Self Care | Payer: Medicare HMO

## 2021-12-13 DIAGNOSIS — M546 Pain in thoracic spine: Secondary | ICD-10-CM | POA: Diagnosis not present

## 2021-12-13 DIAGNOSIS — J9811 Atelectasis: Secondary | ICD-10-CM | POA: Diagnosis not present

## 2021-12-13 DIAGNOSIS — M940 Chondrocostal junction syndrome [Tietze]: Secondary | ICD-10-CM

## 2021-12-13 DIAGNOSIS — M47814 Spondylosis without myelopathy or radiculopathy, thoracic region: Secondary | ICD-10-CM | POA: Diagnosis not present

## 2021-12-13 LAB — POCT URINALYSIS DIP (MANUAL ENTRY)
Bilirubin, UA: NEGATIVE
Glucose, UA: NEGATIVE mg/dL
Nitrite, UA: NEGATIVE
Protein Ur, POC: NEGATIVE mg/dL
Spec Grav, UA: 1.02 (ref 1.010–1.025)
Urobilinogen, UA: 0.2 E.U./dL
pH, UA: 5.5 (ref 5.0–8.0)

## 2021-12-13 MED ORDER — KETOROLAC TROMETHAMINE 30 MG/ML IJ SOLN
30.0000 mg | Freq: Once | INTRAMUSCULAR | Status: AC
  Administered 2021-12-13: 30 mg via INTRAMUSCULAR

## 2021-12-13 MED ORDER — GUAIFENESIN 100 MG/5ML PO LIQD: 100.0000 mg | ORAL | 0 refills | Status: DC | PRN

## 2021-12-13 MED ORDER — PREDNISONE 20 MG PO TABS: 20.0000 mg | ORAL_TABLET | Freq: Every day | ORAL | 0 refills | Status: AC

## 2021-12-13 NOTE — ED Triage Notes (Signed)
Pt reported left sided middle back pain after she had a colonoscopy on 12/11/21. Tylenol gives some relief.   Pt requested urine test.

## 2021-12-13 NOTE — ED Provider Notes (Signed)
[facility]    CSN: [id number] Arrival date & time: 12/13/21  1309      History   Chief Complaint Chief Complaint  Patient presents with   Back Pain    HPI Kristin [name] is a 71 y.o. female.   The history is provided by the patient.   Patient presents for complaints of left-sided thoracic pain that been present for the past 2 to 3 days.  Patient states that she recently had a colonoscopy and was lying on her left side.  She states that she also had general anesthesia for the procedure.  She states since the procedure, she has had pain in the left part of her mid back.  Pain worsens with movement, coughing, and deep breathing.  The area is also tender to palpation.  Patient denies new injury or trauma, but reports that she does have a history of back pain.  She states that she has been taking Tylenol for her symptoms with minimal relief.  Patient denies fever, chills, numbness, weakness, or radiation of pain.  She also denies any urinary symptoms, including left flank pain.  Past Medical History:  Diagnosis Date   Aortic atherosclerosis (HCC) 09/30/2016   Complication of anesthesia    long time to wake up 2018 with same surgery   Coronary atherosclerosis 09/30/2016   By CT findings   Dizziness    Hypothyroidism    Lumbar degenerative disc disease 09/30/2016   Maxillary sinusitis    Osteoporosis    PONV (postoperative nausea and vomiting)    Renal disorder    Sleep apnea    mild osa, does not use CPAP   Thyroid disease     Patient Active Problem List   Diagnosis Date Noted   Pancreatic cyst 11/14/2020   Left flank pain 09/30/2016   LLQ pain 09/30/2016   Bilateral nephrolithiasis 09/30/2016   Lumbar degenerative disc disease 09/30/2016   Aortic atherosclerosis (HCC) 09/30/2016   Coronary atherosclerosis 09/30/2016   Pulmonary nodule 12/06/2011    Past Surgical History:  Procedure Laterality Date   BACK SURGERY  1997/1999   CARPAL TUNNEL RELEASE Right     CYSTECTOMY  1972   brreast   MAXILLARY ANTROSTOMY Left 08/09/2014   Procedure: ENDOSCOPIC MAXILLARY ANTROSTOMY;  Surgeon: Newman Pies, MD;  Location: [facility];  Service: ENT;  Laterality: Left;   MAXILLARY ANTROSTOMY Left 10/29/2017   Procedure: LEFT MAXILLARY ANTROSTOMY WITH TISSUE REMOVAL;  Surgeon: Newman Pies, MD;  Location: [facility];  Service: ENT;  Laterality: Left;   NASAL SINUS SURGERY Left 08/09/2014   Procedure: ENDOSCOPIC SINUS SURGERY;  Surgeon: Newman Pies, MD;  Location: [facility];  Service: ENT;  Laterality: Left;   TUBAL LIGATION  1980    OB History     Gravida  1   Para  1   Term  0   Preterm  0   AB  0   Living  0      SAB  0   IAB  0   Ectopic  0   Multiple  0   Live Births               Home Medications    Prior to Admission medications   Medication Sig Start Date End Date Taking? Authorizing Provider  acetaminophen (TYLENOL) 500 MG tablet Take 500 mg by mouth every 6 (six) hours as needed.   Yes [provider]  guaiFENesin (ROBITUSSIN) 100 MG/5ML  liquid Take 5-10 mLs (100-200 mg total) by mouth every 4 (four) hours as needed for cough or to loosen phlegm. 12/13/21  Yes Leath-Warren, Sadie Haber, NP  predniSONE (DELTASONE) 20 MG tablet Take 1 tablet (20 mg total) by mouth daily with breakfast for 5 days. 12/13/21 12/18/21 Yes Leath-Warren, Sadie Haber, NP  alendronate (FOSAMAX) 70 MG tablet Take 70 mg by mouth once a week. Take with a full glass of water on an empty stomach.    [provider]  calcium gluconate 500 MG tablet Take 1 tablet by mouth daily.    [provider]  Cholecalciferol (VITAMIN D) 2000 UNITS tablet Take 2,000 Units by mouth every evening.    [provider]  co-enzyme Q-10 30 MG capsule Take 30 mg by mouth daily.    [provider]  Levothyroxine Sodium 88 MCG CAPS Take 88 mcg by mouth daily before breakfast.    [provider]   meclizine (ANTIVERT) 25 MG tablet Take 25 mg by mouth 3 (three) times daily as needed for dizziness.    [provider]  rosuvastatin (CRESTOR) 40 MG tablet Take 40 mg by mouth daily.    [provider]    Family History Family History  Problem Relation Age of Onset   Colon cancer Father    Stroke Paternal Grandfather    Cirrhosis Maternal Grandmother        liver   Hypertension Mother     Social History Social History   Tobacco Use   Smoking status: Former    Years: 3.00    Types: Cigarettes    Quit date: 01/30/1971    Years since quitting: 50.9   Smokeless tobacco: Never  Vaping Use   Vaping Use: Never used  Substance Use Topics   Alcohol use: No   Drug use: No     Allergies   Bee venom, Hydrocodone-acetaminophen, Penicillins, and Sulfa antibiotics   Review of Systems Review of Systems Per HPI  Physical Exam Triage Vital Signs ED Triage Vitals  Enc Vitals Group     BP 12/13/21 1359 130/75     Pulse Rate 12/13/21 1359 75     Resp 12/13/21 1359 16     Temp 12/13/21 1359 98.4 F (36.9 C)     Temp Source 12/13/21 1359 Oral     SpO2 12/13/21 1359 95 %     Weight --      Height --      Head Circumference --      Peak Flow --      Pain Score 12/13/21 1357 5     Pain Loc --      Pain Edu? --      Excl. in GC? --    No data found.  Updated Vital Signs BP 130/75 (BP Location: Right Arm)   Pulse 75   Temp 98.4 F (36.9 C) (Oral)   Resp 16   SpO2 95%   Visual Acuity Right Eye Distance:   Left Eye Distance:   Bilateral Distance:    Right Eye Near:   Left Eye Near:    Bilateral Near:     Physical Exam Vitals and nursing note reviewed.  Constitutional:      Appearance: Normal appearance.  HENT:     Head: Normocephalic.  Eyes:     Extraocular Movements: Extraocular movements intact.     Conjunctiva/sclera: Conjunctivae normal.     Pupils: Pupils are equal, round, and reactive to light.  Pulmonary:  Effort: No respiratory  distress.     Breath sounds: Normal breath sounds. No wheezing or rales.  Abdominal:     General: Bowel sounds are normal.     Palpations: Abdomen is soft.  Musculoskeletal:     Thoracic back: Tenderness (Tenderness noted to T12- T10 (left)) present. No swelling, deformity or spasms. Decreased range of motion.  Skin:    General: Skin is warm and dry.  Neurological:     General: No focal deficit present.     Mental Status: She is alert and oriented to person, place, and time.  Psychiatric:        Mood and Affect: Mood normal.        Behavior: Behavior normal.      UC Treatments / Results  Labs (all labs ordered are listed, but only abnormal results are displayed) Labs Reviewed  POCT URINALYSIS DIP (MANUAL ENTRY) - Abnormal; Notable for the following components:      Result Value   Clarity, UA hazy (*)    Ketones, POC UA small (15) (*)    Blood, UA trace-intact (*)    Leukocytes, UA Trace (*)    All other components within normal limits    EKG   Radiology DG Thoracic Spine 2 View  Result Date: 12/13/2021 CLINICAL DATA:  Thoracic back pain for 2 days EXAM: THORACIC SPINE 2 VIEWS COMPARISON:  03/29/2021 FINDINGS: Atherosclerotic calcification of the aortic arch. Linear subsegmental atelectasis or scarring at the left lung base. The L1 vertebral body is not fully included on the lateral projection. No fracture or acute subluxation in the thoracic spine is identified. There is some minimal anterior interbody spurring in the midthoracic spine about the T8-9 and T9-10 levels. IMPRESSION: 1. Minimal thoracic spondylosis. No fracture or acute subluxation identified. 2. Linear subsegmental atelectasis or scarring at the left lung base. 3. Atherosclerotic calcification of the aortic arch. Aortic Atherosclerosis (ICD10-I70.0). Electronically Signed   By: Gaylyn Rong M.D.   On: 12/13/2021 14:44    Procedures Procedures (including critical care time)  Medications Ordered in  UC Medications  ketorolac (TORADOL) 30 MG/ML injection 30 mg (30 mg Intramuscular Given 12/13/21 1435)    Initial Impression / Assessment and Plan / UC Course  I have reviewed the triage vital signs and the nursing notes.  Pertinent labs & imaging results that were available during my care of the patient were reviewed by me and considered in my medical decision making (see chart for details).  Patient is well-appearing, she is in no acute distress, vital signs are stable.  Acute costochondritis Atelectasis  X-rays are consistent with atelectasis in the left lung space.  This is most likely related to the recent procedure and anesthesia the patient completed. Treat with prednisone 40 mg for 5 days for costochondritis.  We will also provide Robitussin as an expectorant to help prevent pneumonia. Recommended coughing and deep breathing exercises every hour to prevent the development of pneumonia. May take over-the-counter Tylenol as needed for pain or discomfort. Strict ER precautions were provided to the patient. Patient was advised to follow-up with her primary care physician within the next 7 to 10 days if symptoms fail to improve.  Patient verbalizes understanding.  All questions were answered.  Patient is stable for discharge. Final Clinical Impressions(s) / UC Diagnoses   Final diagnoses:  Acute costochondritis  Atelectasis     Discharge Instructions      Your x-ray showed that you have atelectasis in the left lung space,  consistent with the recent anesthesia you had. It is recommended that you cough and deep breathe at least 10 times every hour while your symptoms persist. Take medication as prescribed.  I am also prescribing medication to help you cough more and to help you cough up anything that may be in your lungs. Continue Tylenol as needed for pain or discomfort. If you develop worsening pain, difficulty breathing, trouble breathing, or other concerns, please go to the  emergency department immediately. Follow-up with your primary care physician      ED Prescriptions     Medication Sig Dispense Auth. Provider   guaiFENesin (ROBITUSSIN) 100 MG/5ML liquid Take 5-10 mLs (100-200 mg total) by mouth every 4 (four) hours as needed for cough or to loosen phlegm. 60 mL Leath-Warren, Sadie Haber, NP   predniSONE (DELTASONE) 20 MG tablet Take 1 tablet (20 mg total) by mouth daily with breakfast for 5 days. 5 tablet Leath-Warren, Sadie Haber, NP      PDMP not reviewed this encounter.   Abran Cantor, NP 12/14/21 [phone]

## 2021-12-13 NOTE — Discharge Instructions (Addendum)
Your x-ray showed that you have atelectasis in the left lung space, consistent with the recent anesthesia you had. It is recommended that you cough and deep breathe at least 10 times every hour while your symptoms persist. Take medication as prescribed.  I am also prescribing medication to help you cough more and to help you cough up anything that may be in your lungs. Continue Tylenol as needed for pain or discomfort. If you develop worsening pain, difficulty breathing, trouble breathing, or other concerns, please go to the emergency department immediately. Follow-up with your primary care physician

## 2021-12-14 DIAGNOSIS — E038 Other specified hypothyroidism: Secondary | ICD-10-CM | POA: Diagnosis not present

## 2021-12-14 DIAGNOSIS — E063 Autoimmune thyroiditis: Secondary | ICD-10-CM | POA: Diagnosis not present

## 2021-12-18 DIAGNOSIS — E063 Autoimmune thyroiditis: Secondary | ICD-10-CM | POA: Diagnosis not present

## 2021-12-18 DIAGNOSIS — Z79899 Other long term (current) drug therapy: Secondary | ICD-10-CM | POA: Diagnosis not present

## 2021-12-18 DIAGNOSIS — E038 Other specified hypothyroidism: Secondary | ICD-10-CM | POA: Diagnosis not present

## 2021-12-25 ENCOUNTER — Ambulatory Visit
Admission: RE | Admit: 2021-12-25 | Discharge: 2021-12-25 | Disposition: A | Discharge status: Home / Self Care | Payer: Medicare HMO | Source: Ambulatory Visit | Attending: Gastroenterology | Admitting: Gastroenterology

## 2021-12-25 DIAGNOSIS — K8689 Other specified diseases of pancreas: Secondary | ICD-10-CM

## 2021-12-25 DIAGNOSIS — K869 Disease of pancreas, unspecified: Secondary | ICD-10-CM | POA: Diagnosis not present

## 2021-12-25 DIAGNOSIS — K7689 Other specified diseases of liver: Secondary | ICD-10-CM | POA: Diagnosis not present

## 2021-12-25 MED ORDER — GADOPICLENOL 0.5 MMOL/ML IV SOLN
5.5000 mL | Freq: Once | INTRAVENOUS | Status: AC | PRN
  Administered 2021-12-25: 5.5 mL via INTRAVENOUS

## 2021-12-28 DIAGNOSIS — H40033 Anatomical narrow angle, bilateral: Secondary | ICD-10-CM | POA: Diagnosis not present

## 2021-12-28 DIAGNOSIS — H40013 Open angle with borderline findings, low risk, bilateral: Secondary | ICD-10-CM | POA: Diagnosis not present

## 2021-12-28 DIAGNOSIS — H2513 Age-related nuclear cataract, bilateral: Secondary | ICD-10-CM | POA: Diagnosis not present

## 2021-12-28 DIAGNOSIS — H524 Presbyopia: Secondary | ICD-10-CM | POA: Diagnosis not present

## 2021-12-28 DIAGNOSIS — H04123 Dry eye syndrome of bilateral lacrimal glands: Secondary | ICD-10-CM | POA: Diagnosis not present

## 2022-01-01 ENCOUNTER — Ambulatory Visit (INDEPENDENT_AMBULATORY_CARE_PROVIDER_SITE_OTHER): Payer: Medicare HMO | Admitting: Gastroenterology

## 2022-01-01 ENCOUNTER — Encounter (INDEPENDENT_AMBULATORY_CARE_PROVIDER_SITE_OTHER): Payer: Self-pay | Admitting: Gastroenterology

## 2022-01-01 VITALS — BP 135/76 | HR 69 | Temp 98.5°F | Ht 61.25 in | Wt 125.7 lb

## 2022-01-01 DIAGNOSIS — K862 Cyst of pancreas: Secondary | ICD-10-CM

## 2022-01-01 NOTE — Progress Notes (Signed)
Referring Provider: Sharilyn Sites, MD Primary Care Physician:  Sharilyn Sites, MD Primary GI Physician: [place]  Chief Complaint  Patient presents with   Results    Follow up on pancreatic lesion. Had MRI scheduled with Dr. Collene Mares.    HPI:   Kristin [name] is a 71 y.o. female with past medical history of atherosclerosis, hypothyroidism, osteoporosis, OSA , pancreatic lesion  Patient presenting today for follow up of Pancreatic lesion.  Last seen October 2022, at that time pancreatic lesion with cystic appearance at least since 2013. Her case was discussed with Dr. Ardis Hughs who considered that given that the lesion has been stable and did not have criteria for invasive testing, he recommended clinical surveillance or repeating abdominal imaging.  She underwent an MRI of the abdomen and without IV contrast on 07/15/2019 which showed a stable 2.8 x 2.6 cm multicystic lesion in the head of the pancreas without main duct dilation which was very similar to a PET scan from 2013 measuring 2.2 x 2.1 cm.    Repeat MRI in November 2022 with Stable multilobulated cystic mass in the head of the pancreas measuring 2.7 cm.  MRCP done 11/2021 with enlargement of pancreatic cystic mass measuring 3.9x 3.1 x3.5 cm, concerning for growing cystoadenoma.   Present:  Patient reports that she sees Dr. Collene Mares with GI in [place], had recent colonoscopy and repeat MRCP for known pancreatic lesion. Was recommended to have EUS by advanced endoscopist in the practice with Dr. Collene Mares, however, patient states she wanted to keep her follow up here for our recommendations as well. She denies any nausea, vomiting or abdominal pain. No weight loss or changes in appetite, no hx of pancreatitis or new onset of DM. She has some diarrhea at baseline though this has been a chronic issues. No rectal bleeding or melena. She has no GI complaints today   Last Colonoscopy: had colonoscopy with Dr. Collene Mares in November 2023, normal per  patient.    Past Medical History:  Diagnosis Date   Aortic atherosclerosis ([place]) 07/02/8451   Complication of anesthesia    long time to wake up 2018 with same surgery   Coronary atherosclerosis 09/30/2016   By CT findings   Dizziness    Hypothyroidism    Lumbar degenerative disc disease 09/30/2016   Maxillary sinusitis    Osteoporosis    PONV (postoperative nausea and vomiting)    Renal disorder    Sleep apnea    mild osa, does not use CPAP   Thyroid disease     Past Surgical History:  Procedure Laterality Date   BACK SURGERY  1997/1999   CARPAL TUNNEL RELEASE Right    CYSTECTOMY  1972   brreast   MAXILLARY ANTROSTOMY Left 08/09/2014   Procedure: ENDOSCOPIC MAXILLARY ANTROSTOMY;  Surgeon: Leta Baptist, MD;  Location: [place];  Service: ENT;  Laterality: Left;   MAXILLARY ANTROSTOMY Left 10/29/2017   Procedure: LEFT MAXILLARY ANTROSTOMY WITH TISSUE REMOVAL;  Surgeon: Leta Baptist, MD;  Location: [place];  Service: ENT;  Laterality: Left;   NASAL SINUS SURGERY Left 08/09/2014   Procedure: ENDOSCOPIC SINUS SURGERY;  Surgeon: Leta Baptist, MD;  Location: [place];  Service: ENT;  Laterality: Left;   TUBAL LIGATION  1980    Current Outpatient Medications  Medication Sig Dispense Refill   acetaminophen (TYLENOL) 500 MG tablet Take 500 mg by mouth every 6 (six) hours as needed.     alendronate (FOSAMAX) 70 MG tablet  Take 70 mg by mouth once a week. Take with a full glass of water on an empty stomach.     calcium gluconate 500 MG tablet Take 1 tablet by mouth daily.     Cholecalciferol (VITAMIN D) 2000 UNITS tablet Take 2,000 Units by mouth every evening.     co-enzyme Q-10 30 MG capsule Take 30 mg by mouth daily.     Levothyroxine Sodium 88 MCG CAPS Take 88 mcg by mouth daily before breakfast.     meclizine (ANTIVERT) 25 MG tablet Take 25 mg by mouth 3 (three) times daily as needed for dizziness.     rosuvastatin (CRESTOR) 40 MG tablet Take 40 mg  by mouth daily.     No current facility-administered medications for this visit.    Allergies as of 01/01/2022 - Review Complete 01/01/2022  Allergen Reaction Noted   Bee venom Anaphylaxis 09/30/2016   Hydrocodone-acetaminophen Other (See Comments) 02/25/2019   Penicillins Swelling 12/06/2011   Sulfa antibiotics  04/01/2021    Family History  Problem Relation Age of Onset   Colon cancer Father    Stroke Paternal Grandfather    Cirrhosis Maternal Grandmother        liver   Hypertension Mother     Social History   Socioeconomic History   Marital status: Married    Spouse name: Not on file   Number of children: 1   Years of education: Not on file   Highest education level: Not on file  Occupational History    Employer: [employer]  Tobacco Use   Smoking status: Former    Years: 3.00    Types: Cigarettes    Quit date: 01/30/1971    Years since quitting: 50.9    Passive exposure: Past   Smokeless tobacco: Never  Vaping Use   Vaping Use: Never used  Substance and Sexual Activity   Alcohol use: No   Drug use: No   Sexual activity: Yes    Birth control/protection: Surgical    Comment: BTL  Other Topics Concern   Not on file  Social History Narrative   Not on file   Social Determinants of Health   Financial Resource Strain: Not on file  Food Insecurity: Not on file  Transportation Needs: Not on file  Physical Activity: Not on file  Stress: Not on file  Social Connections: Not on file   Review of systems General: negative for malaise, night sweats, fever, chills, weight loss Neck: Negative for lumps, goiter, pain and significant neck swelling Resp: Negative for cough, wheezing, dyspnea at rest CV: Negative for chest pain, leg swelling, palpitations, orthopnea GI: denies melena, hematochezia, nausea, vomiting, diarrhea, constipation, dysphagia, odyonophagia, early satiety or unintentional weight loss.  MSK: Negative for joint pain or swelling, back pain,  and muscle pain. Derm: Negative for itching or rash Psych: Denies depression, anxiety, memory loss, confusion. No homicidal or suicidal ideation.  Heme: Negative for prolonged bleeding, bruising easily, and swollen nodes. Endocrine: Negative for cold or heat intolerance, polyuria, polydipsia and goiter. Neuro: negative for tremor, gait imbalance, syncope and seizures. The remainder of the review of systems is noncontributory.  Physical Exam: BP 135/76 (BP Location: Left Arm, Patient Position: Sitting, Cuff Size: Normal)   Pulse 69   Temp 98.5 F (36.9 C) (Oral)   Ht 5' 1.25" (1.556 m)   Wt 125 lb 11.2 oz (57 kg)   BMI 23.56 kg/m  General:   Alert and oriented. No distress noted. Pleasant and cooperative.  Head:  Normocephalic and atraumatic. Eyes:  Conjuctiva clear without scleral icterus. Mouth:  Oral mucosa pink and moist. Good dentition. No lesions. Heart: Normal rate and rhythm, s1 and s2 heart sounds present.  Lungs: Clear lung sounds in all lobes. Respirations equal and unlabored. Abdomen:  +BS, soft, non-tender and non-distended. No rebound or guarding. No HSM or masses noted. Derm: No palmar erythema or jaundice Msk:  Symmetrical without gross deformities. Normal posture. Extremities:  Without edema. Neurologic:  Alert and  oriented x4 Psych:  Alert and cooperative. Normal mood and affect.  Invalid input(s): "6 MONTHS"   ASSESSMENT: Kristin [name] is a 71 y.o. female presenting today for follow up of pancreatic lesion.  Pancreatic head lesion present since atleast 2013 with ongoing intermittent imaging to monitor for stability. Most recent MRCP done by Dr. Collene Mares showed increase in size of lesions from last imaging in 2022. Per patient she was recommended to have EUS with another GI provider in the same practice as Dr. Collene Mares, however she wanted to follow up here for our recommendations as well. Given slow growth,  this is likely a benign lesion, however, considering it is now  greater than 3cm (3.9x 3.1 x3.5 cm), would also recommend EUS for further evaluation. As patient is established with Dr. Collene Mares and has her screening colonoscopies there, I discussed the importance of having all of her GI care in one location, whether that be with Dr. Collene Mares or here with [place], as we are happy to see her. As she wishes to continue care with Dr. Collene Mares, I recommended reaching out to her office to have EUS set up with them for further evaluation/management of pancreatic lesion.    PLAN:  Follow up with Dr. Youlanda Mighty office for EUS  2. Recommend keeping all GI care in one location   All questions were answered, patient verbalized understanding and is in agreement with plan as outlined above.   Follow Up: TBD  Aviah Sorci L. Alver Sorrow, MSN, APRN, AGNP-C Adult-Gerontology Nurse Practitioner [facility]  I have reviewed the note and agree with the APP's assessment as described in this progress note  [name], MD Gastroenterology and Hepatology [facility]

## 2022-01-01 NOTE — Patient Instructions (Addendum)
I would recommend you having an EUS, as you are already established with Dr Youlanda Mighty, practice, it would be beneficial for continuity of care if they have an advanced endoscopist there, for all of your GI care to take place with them.  it would be best for you to reach out to Dr. Collene Mares to have them set you up for further evaluation with this. If anything changes, we will be happy to take over your GI care.

## 2022-01-03 NOTE — Addendum Note (Signed)
Addended by: Harvel Quale on: 01/03/2022 12:14 AM   Modules accepted: Level of Service

## 2022-01-16 ENCOUNTER — Other Ambulatory Visit: Payer: Self-pay | Admitting: Gastroenterology

## 2022-01-31 DIAGNOSIS — Z6823 Body mass index (BMI) 23.0-23.9, adult: Secondary | ICD-10-CM | POA: Diagnosis not present

## 2022-01-31 DIAGNOSIS — D136 Benign neoplasm of pancreas: Secondary | ICD-10-CM | POA: Diagnosis not present

## 2022-01-31 DIAGNOSIS — N390 Urinary tract infection, site not specified: Secondary | ICD-10-CM | POA: Diagnosis not present

## 2022-02-09 ENCOUNTER — Encounter (HOSPITAL_COMMUNITY): Payer: Self-pay | Admitting: Gastroenterology

## 2022-02-09 NOTE — Progress Notes (Signed)
Attempted to obtain medical history via telephone, unable to reach at this time. HIPAA compliant voicemail message left requesting return call to pre surgical testing department.  

## 2022-02-15 NOTE — Anesthesia Preprocedure Evaluation (Addendum)
Anesthesia Evaluation  Patient identified by MRN, date of birth, ID band Patient awake    Reviewed: Allergy & Precautions, H&P , NPO status , Patient's Chart, lab work & pertinent test results  History of Anesthesia Complications (+) PONV and history of anesthetic complications  Airway Mallampati: II  TM Distance: >3 FB Neck ROM: Full    Dental no notable dental hx. (+) Teeth Intact, Dental Advisory Given   Pulmonary former smoker   Pulmonary exam normal breath sounds clear to auscultation       Cardiovascular Exercise Tolerance: Good negative cardio ROS  Rhythm:Regular Rate:Normal     Neuro/Psych negative neurological ROS  negative psych ROS   GI/Hepatic negative GI ROS, Neg liver ROS,,,  Endo/Other  Hypothyroidism    Renal/GU Renal disease  negative genitourinary   Musculoskeletal  (+) Arthritis , Osteoarthritis,    Abdominal   Peds  Hematology negative hematology ROS (+)   Anesthesia Other Findings   Reproductive/Obstetrics negative OB ROS                              Anesthesia Physical Anesthesia Plan  ASA: 2  Anesthesia Plan: MAC   Post-op Pain Management: Minimal or no pain anticipated   Induction: Intravenous  PONV Risk Score and Plan: 3 and Propofol infusion and Treatment may vary due to age or medical condition  Airway Management Planned: Natural Airway and Simple Face Mask  Additional Equipment:   Intra-op Plan:   Post-operative Plan:   Informed Consent: I have reviewed the patients History and Physical, chart, labs and discussed the procedure including the risks, benefits and alternatives for the proposed anesthesia with the patient or authorized representative who has indicated his/her understanding and acceptance.     Dental advisory given  Plan Discussed with: CRNA  Anesthesia Plan Comments:         Anesthesia Quick Evaluation

## 2022-02-16 ENCOUNTER — Ambulatory Visit (HOSPITAL_COMMUNITY): Payer: Medicare HMO | Admitting: Anesthesiology

## 2022-02-16 ENCOUNTER — Other Ambulatory Visit: Payer: Self-pay

## 2022-02-16 ENCOUNTER — Encounter (HOSPITAL_COMMUNITY)
Admission: RE | Disposition: A | Discharge status: Home / Self Care | Payer: Self-pay | Source: Home / Self Care | Attending: Gastroenterology

## 2022-02-16 ENCOUNTER — Ambulatory Visit (HOSPITAL_COMMUNITY)
Admission: RE | Admit: 2022-02-16 | Discharge: 2022-02-16 | Disposition: A | Discharge status: Home / Self Care | Payer: Medicare HMO | Attending: Gastroenterology | Admitting: Gastroenterology

## 2022-02-16 ENCOUNTER — Ambulatory Visit (HOSPITAL_BASED_OUTPATIENT_CLINIC_OR_DEPARTMENT_OTHER): Payer: Medicare HMO | Admitting: Anesthesiology

## 2022-02-16 ENCOUNTER — Encounter (HOSPITAL_COMMUNITY): Payer: Self-pay | Admitting: Gastroenterology

## 2022-02-16 DIAGNOSIS — D378 Neoplasm of uncertain behavior of other specified digestive organs: Secondary | ICD-10-CM | POA: Diagnosis not present

## 2022-02-16 DIAGNOSIS — K862 Cyst of pancreas: Secondary | ICD-10-CM | POA: Diagnosis not present

## 2022-02-16 DIAGNOSIS — M1990 Unspecified osteoarthritis, unspecified site: Secondary | ICD-10-CM | POA: Insufficient documentation

## 2022-02-16 DIAGNOSIS — Z87891 Personal history of nicotine dependence: Secondary | ICD-10-CM | POA: Diagnosis not present

## 2022-02-16 DIAGNOSIS — E039 Hypothyroidism, unspecified: Secondary | ICD-10-CM

## 2022-02-16 DIAGNOSIS — R933 Abnormal findings on diagnostic imaging of other parts of digestive tract: Secondary | ICD-10-CM | POA: Diagnosis not present

## 2022-02-16 HISTORY — PX: ESOPHAGOGASTRODUODENOSCOPY (EGD) WITH PROPOFOL: SHX5813

## 2022-02-16 HISTORY — PX: UPPER ESOPHAGEAL ENDOSCOPIC ULTRASOUND (EUS): SHX6562

## 2022-02-16 HISTORY — PX: FINE NEEDLE ASPIRATION: SHX5430

## 2022-02-16 SURGERY — ESOPHAGOGASTRODUODENOSCOPY (EGD) WITH PROPOFOL
Anesthesia: Monitor Anesthesia Care

## 2022-02-16 MED ORDER — SODIUM CHLORIDE 0.9 % IV SOLN: INTRAVENOUS | Status: DC

## 2022-02-16 MED ORDER — PROPOFOL 500 MG/50ML IV EMUL
INTRAVENOUS | Status: DC | PRN
  Administered 2022-02-16: 100 ug/kg/min via INTRAVENOUS

## 2022-02-16 MED ORDER — PROPOFOL 500 MG/50ML IV EMUL
INTRAVENOUS | Status: AC
  Filled 2022-02-16: qty 50

## 2022-02-16 MED ORDER — LIDOCAINE 2% (20 MG/ML) 5 ML SYRINGE
INTRAMUSCULAR | Status: DC | PRN
  Administered 2022-02-16: 60 mg via INTRAVENOUS

## 2022-02-16 MED ORDER — PROPOFOL 10 MG/ML IV BOLUS
INTRAVENOUS | Status: DC | PRN
  Administered 2022-02-16: 30 mg via INTRAVENOUS
  Administered 2022-02-16: 20 mg via INTRAVENOUS
  Administered 2022-02-16: 30 mg via INTRAVENOUS

## 2022-02-16 MED ORDER — PROPOFOL 500 MG/50ML IV EMUL
INTRAVENOUS | Status: AC
  Filled 2022-02-16: qty 100

## 2022-02-16 MED ORDER — CIPROFLOXACIN IN D5W 400 MG/200ML IV SOLN
INTRAVENOUS | Status: DC | PRN
  Administered 2022-02-16: 400 mg via INTRAVENOUS

## 2022-02-16 MED ORDER — LACTATED RINGERS IV SOLN
INTRAVENOUS | Status: AC | PRN
  Administered 2022-02-16: 1000 mL via INTRAVENOUS

## 2022-02-16 MED ORDER — CIPROFLOXACIN IN D5W 400 MG/200ML IV SOLN
INTRAVENOUS | Status: AC
  Filled 2022-02-16: qty 200

## 2022-02-16 NOTE — Anesthesia Procedure Notes (Signed)
Procedure Name: MAC Date/Time: 02/16/2022 7:38 AM  Performed by: Jenne Campus, CRNAPre-anesthesia Checklist: Patient identified, Emergency Drugs available, Suction available and Patient being monitored Oxygen Delivery Method: Simple face mask Placement Confirmation: positive ETCO2

## 2022-02-16 NOTE — Op Note (Signed)
[facility] Patient Name: Kristin [name] Procedure Date: 02/16/2022 MRN: [id number] Attending MD: Carol Ada , MD, [id number] Date of Birth: 01-04-1951 CSN: [id number] Age: 72 Admit Type: Outpatient Procedure:                Upper EUS Indications:              Pancreatic cyst on MRI Providers:                Carol Ada, MD, Fanny Skates RN, RN, Cletis Athens,                            Technician Referring MD:              Medicines:                Propofol per Anesthesia Complications:            No immediate complications. Estimated Blood Loss:     Estimated blood loss: none. Procedure:                Pre-Anesthesia Assessment:                           - Prior to the procedure, a History and Physical                            was performed, and patient medications and                            allergies were reviewed. The patient's tolerance of                            previous anesthesia was also reviewed. The risks                            and benefits of the procedure and the sedation                            options and risks were discussed with the patient.                            All questions were answered, and informed consent                            was obtained. Prior Anticoagulants: The patient has                            taken no anticoagulant or antiplatelet agents. ASA                            Grade Assessment: II - A patient with mild systemic                            disease. After reviewing the risks and benefits,  the patient was deemed in satisfactory condition to                            undergo the procedure.                           - Sedation was administered by an anesthesia                            professional. Deep sedation was attained.                           After obtaining informed consent, the endoscope was                            passed under direct vision. Throughout the                             procedure, the patient's blood pressure, pulse, and                            oxygen saturations were monitored continuously. The                            [id number] ([id number]) Olympus linear ultrasound scope                            was introduced through the mouth, and advanced to                            the second part of duodenum. The upper EUS was                            accomplished without difficulty. The patient                            tolerated the procedure well. Scope In: Scope Out: Findings:      ENDOSCOPIC FINDING: :      The examined esophagus was endoscopically normal.      The entire examined stomach was endoscopically normal.      The examined duodenum was endoscopically normal.      ENDOSONOGRAPHIC FINDING: :      Anechoic lesions suggestive of multiple cysts were identified in the       pancreatic head. Diagnostic needle aspiration for fluid was performed.       Color Doppler imaging was utilized prior to needle puncture to confirm a       lack of significant vascular structures within the needle path. One pass       was made with the 22 gauge needle using a transduodenal approach. A       stylet was used. The amount of fluid collected was 2 mL. The fluid was       white, blood-tinged, thin and watery. Sample(s) were sent for amylase       concentration, lipase, cytology and CEA.      In the head of the pancreas  a multicystic lesion was identified with the       possibility of a solid component. At its largest dimension the lesion       measured 3.9 cm x 2.8 cm. Several larger cysts were near the tip of the       echoendoscope and fluid was able to aspirated. The fluid was clear in       the aspiration syringe and it was thin/watery. From the FNA needle there       was evidence of blood tinged fluid. The lesion exhibited many coursing       blood vessels. A safe window for further aspiration and FNA of the       possible solid  component was not possible during this examination. If a       repeat EUS with FNA is necessary a small window does exist at [place] [facility].       At [place] [address] there were a couple of small windows where the vasculature       was minimal. Ciprofloxoacin was provided for her during the procedure.       The CBD was normal in appearance and caliber. A confident view of the PD       in the pancreatic head was not obtained, but there was no upstream PD       dilation. Impression:               - Normal esophagus.                           - Normal stomach.                           - Normal examined duodenum.                           - Multiple cystic lesions were seen in the                            pancreatic head. Fine needle aspiration for fluid                            performed. Moderate Sedation:      Not Applicable - Patient had care per Anesthesia. Recommendation:           - Patient has a contact number available for                            emergencies. The signs and symptoms of potential                            delayed complications were discussed with the                            patient. Return to normal activities tomorrow.                            Written discharge instructions were provided to the  patient.                           - Resume regular diet.                           - Await cytology results and await tumor markers.                           - Cipro x 3 days. Procedure Code(s):        --- Professional ---                           [phone], Esophagogastroduodenoscopy, flexible,                            transoral; with transendoscopic ultrasound-guided                            intramural or transmural fine needle                            aspiration/biopsy(s), (includes endoscopic                            ultrasound examination limited to the esophagus,                            stomach or duodenum, and adjacent  structures) Diagnosis Code(s):        --- Professional ---                           K86.2, Cyst of pancreas CPT copyright 2022 American [employer] Association. All rights reserved. The codes documented in this report are preliminary and upon coder review may  be revised to meet current compliance requirements. Carol Ada, MD Carol Ada, MD 02/16/2022 8:29:57 AM This report has been signed electronically. Number of Addenda: 0

## 2022-02-16 NOTE — Discharge Instructions (Signed)

## 2022-02-16 NOTE — Anesthesia Postprocedure Evaluation (Signed)
Anesthesia Post Note  Patient: VERNESSA LIKES  Procedure(s) Performed: UPPER ESOPHAGEAL ENDOSCOPIC ULTRASOUND (EUS) ESOPHAGOGASTRODUODENOSCOPY (EGD) WITH PROPOFOL FINE NEEDLE ASPIRATION (FNA) LINEAR     Patient location during evaluation: Endoscopy Anesthesia Type: MAC Level of consciousness: awake and alert Pain management: pain level controlled Vital Signs Assessment: post-procedure vital signs reviewed and stable Respiratory status: spontaneous breathing, nonlabored ventilation and respiratory function stable Cardiovascular status: stable and blood pressure returned to baseline Postop Assessment: no apparent nausea or vomiting Anesthetic complications: no  No notable events documented.  Last Vitals:  Vitals:   02/16/22 0830 02/16/22 0840  BP: 129/66 (!) 130/59  Pulse: 62 (!) 58  Resp: 13 18  Temp:    SpO2: 97% 95%    Last Pain:  Vitals:   02/16/22 0840  TempSrc:   PainSc: 0-No pain                 Melrose Kearse,W. EDMOND

## 2022-02-16 NOTE — H&P (Signed)
Kristin [name] HPI: This 72 year old white female presents to the office for a colon cancer screening. She usually has 1 BM per day, but had diarrhea over the last 3 weeks where she can have 2-3 BMs per day and occasionally go a day or 2 without having a BM. There is no obvious blood or mucus in the stool. She denies using any artificial sweeteners. She has good appetite and her weight has been stable. She denies having any complaints of abdominal pain, nausea, vomiting, acid reflux, dysphagia or odynophagia. She denies having a family history of celiac sprue or IBD. Her father died of colon cancer at the age of 73. Her brother was diagnosed with colon cancer at the age of 48. Her last colonoscopy done on 12/12/2018 revealed a healthy appearing colon except for a small cecal polyp that was removed and biopsies revealed benign colonic mucosa with lymphoid aggregates. She had an MRI done on 12/02/2020 which revealed a stable multilobulated cystic mass in the head of the pancreas measuring 2.7 cm, findings most consistent with a benign cystic neoplasm-serous cystadenoma. The lesion has been stable for 2 years. This was being followed by her PCP but she asked if we could order the MRI in [place].  The repeat MRCP on 12/21/2021 showed that the serous cystadenoma was enlarging to 3.9 x 3.1 x 3.5 cm   Past Medical History:  Diagnosis Date   Aortic atherosclerosis ([place]) 8/0/9983   Complication of anesthesia    long time to wake up 2018 with same surgery   Coronary atherosclerosis 09/30/2016   By CT findings   Dizziness    Hypothyroidism    Lumbar degenerative disc disease 09/30/2016   Maxillary sinusitis    Osteoporosis    PONV (postoperative nausea and vomiting)    Renal disorder    Sleep apnea    mild osa, does not use CPAP   Thyroid disease     Past Surgical History:  Procedure Laterality Date   BACK SURGERY  1997/1999   CARPAL TUNNEL RELEASE Right    CYSTECTOMY  1972   brreast   MAXILLARY  ANTROSTOMY Left 08/09/2014   Procedure: ENDOSCOPIC MAXILLARY ANTROSTOMY;  Surgeon: Leta Baptist, MD;  Location: [facility];  Service: ENT;  Laterality: Left;   MAXILLARY ANTROSTOMY Left 10/29/2017   Procedure: LEFT MAXILLARY ANTROSTOMY WITH TISSUE REMOVAL;  Surgeon: Leta Baptist, MD;  Location: [facility];  Service: ENT;  Laterality: Left;   NASAL SINUS SURGERY Left 08/09/2014   Procedure: ENDOSCOPIC SINUS SURGERY;  Surgeon: Leta Baptist, MD;  Location: [place] [facility];  Service: ENT;  Laterality: Left;   TUBAL LIGATION  1980    Family History  Problem Relation Age of Onset   Colon cancer Father    Stroke Paternal Grandfather    Cirrhosis Maternal Grandmother        liver   Hypertension Mother     Social History:  reports that she quit smoking about 51 years ago. Her smoking use included cigarettes. She has been exposed to tobacco smoke. She has never used smokeless tobacco. She reports that she does not drink alcohol and does not use drugs.  Allergies:  Allergies  Allergen Reactions   Bee Venom Anaphylaxis   Hydrocodone-Acetaminophen Other (See Comments)    Diarrhea, vomiting told to discontinue meds   Penicillins Swelling    Has patient had a PCN reaction causing immediate rash, facial/tongue/throat swelling, SOB or lightheadedness with hypotension: No Has patient  had a PCN reaction causing severe rash involving mucus membranes or skin necrosis: No Has patient had a PCN reaction that required hospitalization: No Has patient had a PCN reaction occurring within the last 10 years: No If all of the above answers are "NO", then may proceed with Cephalosporin use.     Sulfa Antibiotics     Medications: Scheduled: Continuous:  sodium chloride     lactated ringers 1,000 mL (02/16/22 0707)    No results found for this or any previous visit (from the past 24 hour(s)).   No results found.  ROS:  As stated above in the HPI otherwise negative.  Pulse 70,  temperature 98.1 F (36.7 C), temperature source Tympanic, resp. rate 15, height '5\' 1"'$  (1.549 m), weight 56.7 kg, SpO2 95 %.    PE: Gen: NAD, Alert and Oriented HEENT:  [place]/AT, EOMI Neck: Supple, no LAD Lungs: CTA Bilaterally CV: RRR without M/G/R ABD: Soft, NTND, +BS Ext: No C/C/E  Assessment/Plan: 1) Enlarging serous cystadenoma - EUS with possible FNA.  Satish Hammers D 02/16/2022, 7:12 AM

## 2022-02-16 NOTE — Transfer of Care (Signed)
Immediate Anesthesia Transfer of Care Note  Patient: SAMA ARAUZ  Procedure(s) Performed: UPPER ESOPHAGEAL ENDOSCOPIC ULTRASOUND (EUS) ESOPHAGOGASTRODUODENOSCOPY (EGD) WITH PROPOFOL FINE NEEDLE ASPIRATION (FNA) LINEAR  Patient Location: Endoscopy Unit  Anesthesia Type:MAC  Level of Consciousness: oriented, drowsy, and patient cooperative  Airway & Oxygen Therapy: Patient Spontanous Breathing and Patient connected to face mask oxygen  Post-op Assessment: Report given to RN and Post -op Vital signs reviewed and stable  Post vital signs: Reviewed  Last Vitals:  Vitals Value Taken Time  BP 119/64 02/16/22 0823  Temp 36.6 C 02/16/22 0823  Pulse 61 02/16/22 0823  Resp 21 02/16/22 0823  SpO2 100 % 02/16/22 0823  Vitals shown include unvalidated device data.  Last Pain:  Vitals:   02/16/22 0823  TempSrc: Temporal  PainSc: Asleep         Complications: No notable events documented.

## 2022-02-18 ENCOUNTER — Encounter (HOSPITAL_COMMUNITY): Payer: Self-pay | Admitting: Gastroenterology

## 2022-02-27 DIAGNOSIS — Z1211 Encounter for screening for malignant neoplasm of colon: Secondary | ICD-10-CM | POA: Diagnosis not present

## 2022-02-27 DIAGNOSIS — M810 Age-related osteoporosis without current pathological fracture: Secondary | ICD-10-CM | POA: Diagnosis not present

## 2022-02-27 DIAGNOSIS — Z01419 Encounter for gynecological examination (general) (routine) without abnormal findings: Secondary | ICD-10-CM | POA: Diagnosis not present

## 2022-02-27 DIAGNOSIS — Z124 Encounter for screening for malignant neoplasm of cervix: Secondary | ICD-10-CM | POA: Diagnosis not present

## 2022-02-27 DIAGNOSIS — Z6823 Body mass index (BMI) 23.0-23.9, adult: Secondary | ICD-10-CM | POA: Diagnosis not present

## 2022-02-27 DIAGNOSIS — Z1231 Encounter for screening mammogram for malignant neoplasm of breast: Secondary | ICD-10-CM | POA: Diagnosis not present

## 2022-04-25 DIAGNOSIS — Z0100 Encounter for examination of eyes and vision without abnormal findings: Secondary | ICD-10-CM | POA: Diagnosis not present

## 2022-05-11 DIAGNOSIS — H8113 Benign paroxysmal vertigo, bilateral: Secondary | ICD-10-CM | POA: Diagnosis not present

## 2022-05-11 DIAGNOSIS — Z6824 Body mass index (BMI) 24.0-24.9, adult: Secondary | ICD-10-CM | POA: Diagnosis not present

## 2022-06-13 DIAGNOSIS — E038 Other specified hypothyroidism: Secondary | ICD-10-CM | POA: Diagnosis not present

## 2022-06-13 DIAGNOSIS — E782 Mixed hyperlipidemia: Secondary | ICD-10-CM | POA: Diagnosis not present

## 2022-06-13 DIAGNOSIS — E063 Autoimmune thyroiditis: Secondary | ICD-10-CM | POA: Diagnosis not present

## 2022-06-18 DIAGNOSIS — E063 Autoimmune thyroiditis: Secondary | ICD-10-CM | POA: Diagnosis not present

## 2022-06-18 DIAGNOSIS — E038 Other specified hypothyroidism: Secondary | ICD-10-CM | POA: Diagnosis not present

## 2022-07-20 DIAGNOSIS — E063 Autoimmune thyroiditis: Secondary | ICD-10-CM | POA: Diagnosis not present

## 2022-08-14 ENCOUNTER — Other Ambulatory Visit: Payer: Self-pay | Admitting: Gastroenterology

## 2022-08-14 DIAGNOSIS — K862 Cyst of pancreas: Secondary | ICD-10-CM

## 2022-08-20 ENCOUNTER — Encounter: Payer: Self-pay | Admitting: Gastroenterology

## 2022-08-22 ENCOUNTER — Ambulatory Visit
Admission: RE | Admit: 2022-08-22 | Discharge: 2022-08-22 | Disposition: A | Discharge status: Home / Self Care | Payer: Medicare HMO | Source: Ambulatory Visit | Attending: Gastroenterology | Admitting: Gastroenterology

## 2022-08-22 DIAGNOSIS — I700 Atherosclerosis of aorta: Secondary | ICD-10-CM | POA: Diagnosis not present

## 2022-08-22 DIAGNOSIS — K862 Cyst of pancreas: Secondary | ICD-10-CM

## 2022-08-22 DIAGNOSIS — R1032 Left lower quadrant pain: Secondary | ICD-10-CM | POA: Diagnosis not present

## 2022-08-22 DIAGNOSIS — K8020 Calculus of gallbladder without cholecystitis without obstruction: Secondary | ICD-10-CM | POA: Diagnosis not present

## 2022-08-22 DIAGNOSIS — K869 Disease of pancreas, unspecified: Secondary | ICD-10-CM | POA: Diagnosis not present

## 2022-08-22 DIAGNOSIS — R911 Solitary pulmonary nodule: Secondary | ICD-10-CM | POA: Diagnosis not present

## 2022-08-22 MED ORDER — GADOPICLENOL 0.5 MMOL/ML IV SOLN
7.0000 mL | Freq: Once | INTRAVENOUS | Status: AC | PRN
  Administered 2022-08-22: 7 mL via INTRAVENOUS

## 2022-09-10 ENCOUNTER — Ambulatory Visit
Admission: RE | Admit: 2022-09-10 | Discharge: 2022-09-10 | Disposition: A | Discharge status: Home / Self Care | Payer: Medicare HMO | Source: Ambulatory Visit | Attending: Nurse Practitioner | Admitting: Nurse Practitioner

## 2022-09-10 ENCOUNTER — Ambulatory Visit: Payer: Medicare HMO

## 2022-09-10 VITALS — BP 139/77 | HR 73 | Temp 98.4°F | Resp 18

## 2022-09-10 DIAGNOSIS — S9032XA Contusion of left foot, initial encounter: Secondary | ICD-10-CM

## 2022-09-10 DIAGNOSIS — S90122A Contusion of left lesser toe(s) without damage to nail, initial encounter: Secondary | ICD-10-CM | POA: Diagnosis not present

## 2022-09-10 DIAGNOSIS — M19072 Primary osteoarthritis, left ankle and foot: Secondary | ICD-10-CM | POA: Diagnosis not present

## 2022-09-10 DIAGNOSIS — M79672 Pain in left foot: Secondary | ICD-10-CM

## 2022-09-10 DIAGNOSIS — M7662 Achilles tendinitis, left leg: Secondary | ICD-10-CM | POA: Diagnosis not present

## 2022-09-10 NOTE — ED Triage Notes (Signed)
Pt states she dropped a heavy lamp on her left foot Saturday. Now having pain with walking, swelling and bruising on left 2nd and 3rd toes. Pt states her big toe on left foot had turned inwards laying on 2nd toe and she moved it back in place but now having pain with movement.

## 2022-09-10 NOTE — ED Provider Notes (Signed)
[facility]    CSN: [id number] Arrival date & time: 09/10/22  1257      History   Chief Complaint Chief Complaint  Patient presents with   Foot Injury    HPI Kristin [name] is a 72 y.o. female.   The history is provided by the patient.   The patient presents for complaints of left foot pain after she dropped a lamp on the left foot approximately 2 days ago.  Patient presents with pain with walking, and swelling and bruising to the left second and third toes.  She states that when the lamp fell, the left great toe bent over her second toe and she "popped it back into place".  She denies fever, chills, inability to ambulate, numbness, tingling, radiation of pain, or left ankle pain.  Patient denies previous injury or trauma to the left foot.  Past Medical History:  Diagnosis Date   Aortic atherosclerosis ([facility]) 09/30/2016   Complication of anesthesia    long time to wake up 2018 with same surgery   Coronary atherosclerosis 09/30/2016   By CT findings   Dizziness    Hypothyroidism    Lumbar degenerative disc disease 09/30/2016   Maxillary sinusitis    Osteoporosis    PONV (postoperative nausea and vomiting)    Renal disorder    Sleep apnea    mild osa, does not use CPAP   Thyroid disease     Patient Active Problem List   Diagnosis Date Noted   Cyst of pancreas 11/14/2020   Left flank pain 09/30/2016   LLQ pain 09/30/2016   Bilateral nephrolithiasis 09/30/2016   Lumbar degenerative disc disease 09/30/2016   Aortic atherosclerosis ([facility]) 09/30/2016   Coronary atherosclerosis 09/30/2016   Pulmonary nodule 12/06/2011    Past Surgical History:  Procedure Laterality Date   BACK SURGERY  1997/1999   CARPAL TUNNEL RELEASE Right    CYSTECTOMY  1972   brreast   ESOPHAGOGASTRODUODENOSCOPY (EGD) WITH PROPOFOL N/A 02/16/2022   Procedure: ESOPHAGOGASTRODUODENOSCOPY (EGD) WITH PROPOFOL;  Surgeon: Jeani Hawking, MD;  Location: [facility];  Service: Gastroenterology;   Laterality: N/A;   FINE NEEDLE ASPIRATION N/A 02/16/2022   Procedure: FINE NEEDLE ASPIRATION (FNA) LINEAR;  Surgeon: Jeani Hawking, MD;  Location: [facility];  Service: Gastroenterology;  Laterality: N/A;   MAXILLARY ANTROSTOMY Left 08/09/2014   Procedure: ENDOSCOPIC MAXILLARY ANTROSTOMY;  Surgeon: Newman Pies, MD;  Location: [facility];  Service: ENT;  Laterality: Left;   MAXILLARY ANTROSTOMY Left 10/29/2017   Procedure: LEFT MAXILLARY ANTROSTOMY WITH TISSUE REMOVAL;  Surgeon: Newman Pies, MD;  Location: [facility];  Service: ENT;  Laterality: Left;   NASAL SINUS SURGERY Left 08/09/2014   Procedure: ENDOSCOPIC SINUS SURGERY;  Surgeon: Newman Pies, MD;  [facility];  Service: ENT;  Laterality: Left;   TUBAL LIGATION  1980   UPPER ESOPHAGEAL ENDOSCOPIC ULTRASOUND (EUS) N/A 02/16/2022   Procedure: UPPER ESOPHAGEAL ENDOSCOPIC ULTRASOUND (EUS);  Surgeon: Jeani Hawking, MD;  Location: [facility];  Service: Gastroenterology;  Laterality: N/A;    OB History     Gravida  1   Para  1   Term  0   Preterm  0   AB  0   Living  0      SAB  0   IAB  0   Ectopic  0   Multiple  0   Live Births  Home Medications    Prior to Admission medications   Medication Sig Start Date End Date Taking? Authorizing Provider  acetaminophen (TYLENOL) 500 MG tablet Take 500 mg by mouth every 6 (six) hours as needed.   Yes [provider]  alendronate (FOSAMAX) 70 MG tablet Take 70 mg by mouth every Friday. Take with a full glass of water on an empty stomach.   Yes [provider]  Ca Phosphate-Cholecalciferol (EQL CALCIUM GUMMIES PO) Take 1 tablet by mouth in the morning, at noon, and at bedtime.   Yes [provider]  Cholecalciferol (VITAMIN D) 125 MCG (5000 UT) CAPS Take 5,000 Units by mouth daily with lunch.   Yes [provider]  Coenzyme Q10 200 MG capsule Take 200 mg by mouth every evening.   Yes  [provider]  levothyroxine (SYNTHROID) 88 MCG tablet Take 88 mcg by mouth daily before breakfast.   Yes [provider]  meclizine (ANTIVERT) 25 MG tablet Take 25 mg by mouth 3 (three) times daily as needed for dizziness.   Yes [provider]  Omega-3 Fatty Acids (FISH OIL ULTRA) 1400 MG CAPS Take 1,400 mg by mouth daily with lunch.   Yes [provider]  Prenatal Vit-Fe Fumarate-FA (MULTIVITAMIN-PRENATAL) 27-0.8 MG TABS tablet Take 1 tablet by mouth daily with lunch.   Yes [provider]  rosuvastatin (CRESTOR) 40 MG tablet Take 40 mg by mouth every evening.   Yes [provider]    Family History Family History  Problem Relation Age of Onset   Colon cancer Father    Stroke Paternal Grandfather    Cirrhosis Maternal Grandmother        liver   Hypertension Mother     Social History Social History   Tobacco Use   Smoking status: Former    Current packs/day: 0.00    Types: Cigarettes    Start date: 01/30/1968    Quit date: 01/30/1971    Years since quitting: 51.6    Passive exposure: Past   Smokeless tobacco: Never  Vaping Use   Vaping status: Never Used  Substance Use Topics   Alcohol use: No   Drug use: No     Allergies   Bee venom, Hydrocodone-acetaminophen, Penicillins, and Sulfa antibiotics   Review of Systems Review of Systems Per HPI  Physical Exam Triage Vital Signs ED Triage Vitals [09/10/22 1330]  Encounter Vitals Group     BP 139/77     Systolic BP Percentile      Diastolic BP Percentile      Pulse Rate 73     Resp 18     Temp 98.4 F (36.9 C)     Temp Source Oral     SpO2 97 %     Weight      Height      Head Circumference      Peak Flow      Pain Score 4     Pain Loc      Pain Education      Exclude from Growth Chart    No data found.  Updated Vital Signs BP 139/77 (BP Location: Right Arm)   Pulse 73   Temp 98.4 F (36.9 C) (Oral)   Resp 18   SpO2 97%   Visual Acuity Right  Eye Distance:   Left Eye Distance:   Bilateral Distance:    Right Eye Near:   Left Eye Near:    Bilateral Near:  Physical Exam Vitals and nursing note reviewed.  Constitutional:      General: She is not in acute distress.    Appearance: Normal appearance.  HENT:     Head: Normocephalic.  Eyes:     Pupils: Pupils are equal, round, and reactive to light.  Pulmonary:     Effort: Pulmonary effort is normal.  Musculoskeletal:     Left foot: Normal capillary refill. Swelling and tenderness present. Normal pulse.     Comments: Swelling and bruising noted to the second and third phalanges of the left foot.  Toes are tender to palpation.  +FROM.  Abrasions noted to the third toe and on the bases of the second and third toes. Cap refill > 2 sec. neurovascular status is intact.  Skin:    General: Skin is warm and dry.  Neurological:     General: No focal deficit present.     Mental Status: She is alert and oriented to person, place, and time.  Psychiatric:        Mood and Affect: Mood normal.        Behavior: Behavior normal.      UC Treatments / Results  Labs (all labs ordered are listed, but only abnormal results are displayed) Labs Reviewed - No data to display  EKG   Radiology DG Foot Complete Left  Result Date: 09/10/2022 CLINICAL DATA:  Left foot pain after dropping a lamp on her foot 3 days ago. EXAM: LEFT FOOT - COMPLETE 3+ VIEW COMPARISON:  None Available. FINDINGS: The bones appear mildly demineralized. No evidence of acute fracture or dislocation. Mild interphalangeal and 1st MTP joint degenerative changes. The alignment appears normal at the Lisfranc joint. Small enthesophyte at the Achilles insertion on the calcaneus. The soft tissues otherwise appear unremarkable. IMPRESSION: No evidence of acute fracture or dislocation. Mild degenerative changes. Electronically Signed   By: Carey Bullocks M.D.   On: 09/10/2022 14:44    Procedures Procedures (including critical  care time)  Medications Ordered in UC Medications - No data to display  Initial Impression / Assessment and Plan / UC Course  I have reviewed the triage vital signs and the nursing notes.  Pertinent labs & imaging results that were available during my care of the patient were reviewed by me and considered in my medical decision making (see chart for details).  The patient is well-appearing, she is in no acute distress, vital signs are stable.  X-ray of the left foot is negative for fracture or dislocation.  Symptoms appear to be consistent with a contusion of the second and third phalanges.  Second and third toes of the left foot were buddy taped, and a postop shoe was provided.  Supportive care recommendations were provided and discussed with the patient to include Tylenol for pain or discomfort, and RICE therapy.  Patient was advised that if symptoms are not improving over the next 2 to 3 weeks, recommend following up with orthopedics for further evaluation.  Patient was given information for [employer] and for [facility].  Patient was in agreement with this plan of care and verbalizes understanding.  All questions were answered.  Patient stable for discharge.   Final Clinical Impressions(s) / UC Diagnoses   Final diagnoses:  Contusion of left foot including toes, initial encounter     Discharge Instructions      The x-ray was negative for fracture or dislocation.  It appears that you have a bruise or contusion to the second and third  toes of your left foot. Recommend Tylenol arthritis strength 650 mg tablets for pain or discomfort. RICE therapy, rest, ice, compression, and elevation.  Apply ice for 20 minutes, remove for 1 hour, repeat as needed. You have been provided a postop shoe and your toes have been "buddy taped" to provide support.  Wear the postop shoe when you are engaged in prolonged or strenuous activity. Symptoms should be improving over the next 2 to 3  weeks.  If not, recommend that you follow-up with orthopedics for further evaluation.  You can follow-up with [facility] or with [facility]. Follow-up as needed.     ED Prescriptions   None    PDMP not reviewed this encounter.   Abran Cantor, NP 09/10/22 1451

## 2022-09-10 NOTE — Discharge Instructions (Addendum)
The x-ray was negative for fracture or dislocation.  It appears that you have a bruise or contusion to the second and third toes of your left foot. Recommend Tylenol arthritis strength 650 mg tablets for pain or discomfort. RICE therapy, rest, ice, compression, and elevation.  Apply ice for 20 minutes, remove for 1 hour, repeat as needed. You have been provided a postop shoe and your toes have been "buddy taped" to provide support.  Wear the postop shoe when you are engaged in prolonged or strenuous activity. Symptoms should be improving over the next 2 to 3 weeks.  If not, recommend that you follow-up with orthopedics for further evaluation.  You can follow-up with [facility] or with [facility]. Follow-up as needed.

## 2022-09-24 ENCOUNTER — Ambulatory Visit (INDEPENDENT_AMBULATORY_CARE_PROVIDER_SITE_OTHER): Payer: Medicare HMO | Admitting: Gastroenterology

## 2022-09-24 ENCOUNTER — Encounter (INDEPENDENT_AMBULATORY_CARE_PROVIDER_SITE_OTHER): Payer: Self-pay | Admitting: Gastroenterology

## 2022-09-24 VITALS — BP 125/75 | HR 71 | Temp 98.4°F | Ht 61.5 in | Wt 126.4 lb

## 2022-09-24 DIAGNOSIS — K862 Cyst of pancreas: Secondary | ICD-10-CM

## 2022-09-24 DIAGNOSIS — D279 Benign neoplasm of unspecified ovary: Secondary | ICD-10-CM | POA: Diagnosis not present

## 2022-09-24 NOTE — Patient Instructions (Addendum)
Will request lab results from most recent endoscopic ultrasound performed by Dr. Elnoria Howard Pending lab results may need to have evaluation at [facility] for repeat endoscopic ultrasound versus surveillance imaging with MRCP in January 2025 Repeat colonoscopy in November 2026

## 2022-09-24 NOTE — Progress Notes (Signed)
Kristin [name], M.D. Gastroenterology & Hepatology [facility] Gastroenterology [address] [place], [place] [address]  Primary Care Physician: Assunta Found, MD [address] [place] [address]  I will communicate my assessment and recommendations to the referring MD via EMR.  Problems: Large pancreatic lesion, possible serous cystadenoma but unclear if there was a solid component based on endoscopic ultrasound  History of Present Illness: Kristin [name] is a 72 y.o. female with past medical history of atherosclerosis, hypothyroidism, osteoporosis, OSA , pancreatic lesion, coming for follow-up of pancreatic lesion.  The patient was last seen 01/01/2022. At that time, the patient was advised to follow-up with Dr Loreta Ave and [facility] for endoscopic ultrasound surveillance of her pancreatic lesion.  Her case was discussed with Dr. Christella Hartigan who considered that given that the lesion has been stable and did not have criteria for invasive testing, he recommended clinical surveillance or repeating abdominal imaging.  She underwent an MRI of the abdomen and without IV contrast on 07/15/2019 which showed a stable 2.8 x 2.6 cm multicystic lesion in the head of the pancreas without main duct dilation which was very similar to a PET scan from 2013 measuring 2.2 x 2.1 cm.     Repeat MRI in November 2022 with Stable multilobulated cystic mass in the head of the pancreas measuring 2.7 cm.   MRCP done 11/2021 with enlargement of pancreatic cystic mass measuring 3.9x 3.1 x3.5 cm, concerning for growing cystoadenoma.   Patient underwent an EUS on 02/16/2022 by Dr. Jeani Hawking at [facility]. Report showed: - Anechoic lesions suggestive of multiple cysts were identified in the pancreatic head. Diagnostic needle aspiration for fluid was performed. Color Doppler imaging was utilized prior to needle puncture to confirm a lack of significant vascular structures within  the needle path. One pass was made with the 22 gauge needle using a transduodenal approach. A stylet was used. The amount of fluid collected was 2 mL. The fluid was white, blood- tinged, thin and watery. Sample( s) were sent for amylase concentration, lipase, cytology and CEA.  In the head of the pancreas a multicystic lesion was identified with the possibility of a solid component. At its largest dimension the lesion measured 3. 9 cm x 2. 8 cm. Several larger cysts were near the tip of the echoendoscope and fluid was able to aspirated. The fluid was clear in the aspiration syringe and it was thin/ watery. From the FNA needle there was evidence of blood tinged fluid. The lesion exhibited many coursing blood vessels. A safe window for further aspiration and FNA of the possible solid component was not possible during this examination. If a repeat EUS with FNA is necessary a small window does exist at Station 4. At Station 3 there were a couple of small windows where the vasculature was minimal. Ciprofloxoacin was provided for her during the procedure. The CBD was normal in appearance and caliber. A confident view of the PD in the pancreatic head was not obtained, but there was no upstream PD dilation.  Unfortunately, no results of fluid analysis are available.  Subsequently, the patient underwent an MRCP ordered by Dr. Loreta Ave, which was performed on 08/22/2022.  This showed a 3.3 cm complex cystic mass in the pancreatic head still consistent with a serous cystadenoma.  There was stable mild pancreatic ductal dilation.  Patient reports feeling well and denies any complaints.The patient denies having any nausea, vomiting, fever, chills, hematochezia, melena, hematemesis, abdominal distention, abdominal pain,  diarrhea, jaundice, pruritus or weight loss.  Last Colonoscopy: had colonoscopy with Dr. Loreta Ave in November 2023, normal per patient.   Recommended to repeat every 3 years - brother and father had colon  cancer  Past Medical History: Past Medical History:  Diagnosis Date   Aortic atherosclerosis (HCC) 09/30/2016   Complication of anesthesia    long time to wake up 2018 with same surgery   Coronary atherosclerosis 09/30/2016   By CT findings   Dizziness    Hypothyroidism    Lumbar degenerative disc disease 09/30/2016   Maxillary sinusitis    Osteoporosis    PONV (postoperative nausea and vomiting)    Renal disorder    Sleep apnea    mild osa, does not use CPAP   Thyroid disease     Past Surgical History: Past Surgical History:  Procedure Laterality Date   BACK SURGERY  1997/1999   CARPAL TUNNEL RELEASE Right    CYSTECTOMY  1972   brreast   ESOPHAGOGASTRODUODENOSCOPY (EGD) WITH PROPOFOL N/A 02/16/2022   Procedure: ESOPHAGOGASTRODUODENOSCOPY (EGD) WITH PROPOFOL;  Surgeon: Jeani Hawking, MD;  Location: [facility];  Service: Gastroenterology;  Laterality: N/A;   FINE NEEDLE ASPIRATION N/A 02/16/2022   Procedure: FINE NEEDLE ASPIRATION (FNA) LINEAR;  Surgeon: Jeani Hawking, MD;  Location: [facility];  Service: Gastroenterology;  Laterality: N/A;   MAXILLARY ANTROSTOMY Left 08/09/2014   Procedure: ENDOSCOPIC MAXILLARY ANTROSTOMY;  Surgeon: Newman Pies, MD;  Location: [facility];  Service: ENT;  Laterality: Left;   MAXILLARY ANTROSTOMY Left 10/29/2017   Procedure: LEFT MAXILLARY ANTROSTOMY WITH TISSUE REMOVAL;  Surgeon: Newman Pies, MD;  Location: [facility];  Service: ENT;  Laterality: Left;   NASAL SINUS SURGERY Left 08/09/2014   Procedure: ENDOSCOPIC SINUS SURGERY;  Surgeon: Newman Pies, MD;  Location: [facility];  Service: ENT;  Laterality: Left;   TUBAL LIGATION  1980   UPPER ESOPHAGEAL ENDOSCOPIC ULTRASOUND (EUS) N/A 02/16/2022   Procedure: UPPER ESOPHAGEAL ENDOSCOPIC ULTRASOUND (EUS);  Surgeon: Jeani Hawking, MD;  Location: [facility];  Service: Gastroenterology;  Laterality: N/A;    Family History: Family History  Problem Relation Age of Onset    Colon cancer Father    Stroke Paternal Grandfather    Cirrhosis Maternal Grandmother        liver   Hypertension Mother     Social History: Social History   Tobacco Use  Smoking Status Former   Current packs/day: 0.00   Types: Cigarettes   Start date: 01/30/1968   Quit date: 01/30/1971   Years since quitting: 51.6   Passive exposure: Past  Smokeless Tobacco Never   Social History   Substance and Sexual Activity  Alcohol Use No   Social History   Substance and Sexual Activity  Drug Use No    Allergies: Allergies  Allergen Reactions   Bee Venom Anaphylaxis   Hydrocodone-Acetaminophen Other (See Comments)    Diarrhea, vomiting told to discontinue meds   Penicillins Swelling    Has patient had a PCN reaction causing immediate rash, facial/tongue/throat swelling, SOB or lightheadedness with hypotension: No Has patient had a PCN reaction causing severe rash involving mucus membranes or skin necrosis: No Has patient had a PCN reaction that required hospitalization: No Has patient had a PCN reaction occurring within the last 10 years: No If all of the above answers are "NO", then may proceed with Cephalosporin use.     Sulfa Antibiotics     Medications: Current Outpatient Medications  Medication Sig  Dispense Refill   acetaminophen (TYLENOL) 500 MG tablet Take 500 mg by mouth every 6 (six) hours as needed.     alendronate (FOSAMAX) 70 MG tablet Take 70 mg by mouth every Friday. Take with a full glass of water on an empty stomach.     Ca Phosphate-Cholecalciferol (EQL CALCIUM GUMMIES PO) Take 1 tablet by mouth in the morning, at noon, and at bedtime.     Cholecalciferol (VITAMIN D) 125 MCG (5000 UT) CAPS Take 5,000 Units by mouth daily with lunch.     Coenzyme Q10 200 MG capsule Take 200 mg by mouth every evening.     levothyroxine (SYNTHROID) 88 MCG tablet Take 88 mcg by mouth daily before breakfast.     meclizine (ANTIVERT) 25 MG tablet Take 25 mg by mouth 3 (three)  times daily as needed for dizziness.     Omega-3 Fatty Acids (FISH OIL ULTRA) 1400 MG CAPS Take 1,400 mg by mouth daily with lunch.     Prenatal Vit-Fe Fumarate-FA (MULTIVITAMIN-PRENATAL) 27-0.8 MG TABS tablet Take 1 tablet by mouth daily with lunch.     rosuvastatin (CRESTOR) 40 MG tablet Take 40 mg by mouth every evening.     No current facility-administered medications for this visit.    Review of Systems: GENERAL: negative for malaise, night sweats HEENT: No changes in hearing or vision, no nose bleeds or other nasal problems. NECK: Negative for lumps, goiter, pain and significant neck swelling RESPIRATORY: Negative for cough, wheezing CARDIOVASCULAR: Negative for chest pain, leg swelling, palpitations, orthopnea GI: SEE HPI MUSCULOSKELETAL: Negative for joint pain or swelling, back pain, and muscle pain. SKIN: Negative for lesions, rash PSYCH: Negative for sleep disturbance, mood disorder and recent psychosocial stressors. HEMATOLOGY Negative for prolonged bleeding, bruising easily, and swollen nodes. ENDOCRINE: Negative for cold or heat intolerance, polyuria, polydipsia and goiter. NEURO: negative for tremor, gait imbalance, syncope and seizures. The remainder of the review of systems is noncontributory.   Physical Exam: BP 125/75 (BP Location: Left Arm, Patient Position: Sitting, Cuff Size: Normal)   Pulse 71   Temp 98.4 F (36.9 C) (Temporal)   Ht 5' 1.5" (1.562 m)   Wt 126 lb 6.4 oz (57.3 kg)   BMI 23.50 kg/m  GENERAL: The patient is AO x3, in no acute distress. HEENT: Head is normocephalic and atraumatic. EOMI are intact. Mouth is well hydrated and without lesions. NECK: Supple. No masses LUNGS: Clear to auscultation. No presence of rhonchi/wheezing/rales. Adequate chest expansion HEART: RRR, normal s1 and s2. ABDOMEN: Soft, nontender, no guarding, no peritoneal signs, and nondistended. BS +. No masses. EXTREMITIES: Without any cyanosis, clubbing, rash, lesions or  edema. NEUROLOGIC: AOx3, no focal motor deficit. SKIN: no jaundice, no rashes  Imaging/Labs: as above  I personally reviewed and interpreted the available labs, imaging and endoscopic files.  Impression and Plan: KELLIJO LEGATE is a 72 y.o. female with past medical history of atherosclerosis, hypothyroidism, osteoporosis, OSA , pancreatic lesion, coming for follow-up of pancreatic lesion.  Patient has had surveillance of her pancreatic lesion for multiple years which has grown in size.  It reached a max size of 3.9 cm last year, for which she underwent evaluation with endoscopic ultrasound.  The endoscopic ultrasound showed changes that were possibly suggestive of a serous cystadenoma but unfortunately, sampling of the possible solid component was difficult given vascularity of the lesion.  Also, I do not have access to the fluid analysis sent by Dr. Elnoria Howard.  Due to this, we will request  these labs as it will help [place] determine if there was a mucinous component in the cyst.  Will have a low threshold to refer her for repeat endoscopic ultrasound depending on findings (patient would like to be referred to [facility] if needed).  Reassuringly, she has been asymptomatic and has not presented new onset diabetes, also her most recent MRCP showed the lesion was smaller in size.  - Will request lab results from most recent endoscopic ultrasound performed by Dr. Elnoria Howard - Pending fluid analysis results, may need to have evaluation at [facility] for repeat endoscopic ultrasound versus surveillance imaging with MRCP in January 2025 - Repeat colonoscopy in November 2026  All questions were answered.      Kristin Blazing, MD Gastroenterology and Hepatology [facility] Gastroenterology

## 2022-10-12 DIAGNOSIS — E782 Mixed hyperlipidemia: Secondary | ICD-10-CM | POA: Diagnosis not present

## 2022-10-12 DIAGNOSIS — E039 Hypothyroidism, unspecified: Secondary | ICD-10-CM | POA: Diagnosis not present

## 2022-10-12 DIAGNOSIS — Z0001 Encounter for general adult medical examination with abnormal findings: Secondary | ICD-10-CM | POA: Diagnosis not present

## 2022-10-12 DIAGNOSIS — R7309 Other abnormal glucose: Secondary | ICD-10-CM | POA: Diagnosis not present

## 2022-10-12 DIAGNOSIS — D136 Benign neoplasm of pancreas: Secondary | ICD-10-CM | POA: Diagnosis not present

## 2022-10-12 DIAGNOSIS — Z6823 Body mass index (BMI) 23.0-23.9, adult: Secondary | ICD-10-CM | POA: Diagnosis not present

## 2022-10-12 DIAGNOSIS — K219 Gastro-esophageal reflux disease without esophagitis: Secondary | ICD-10-CM | POA: Diagnosis not present

## 2022-10-12 DIAGNOSIS — Z23 Encounter for immunization: Secondary | ICD-10-CM | POA: Diagnosis not present

## 2022-10-12 DIAGNOSIS — E7849 Other hyperlipidemia: Secondary | ICD-10-CM | POA: Diagnosis not present

## 2022-10-12 DIAGNOSIS — Z1331 Encounter for screening for depression: Secondary | ICD-10-CM | POA: Diagnosis not present

## 2022-11-22 DIAGNOSIS — M7711 Lateral epicondylitis, right elbow: Secondary | ICD-10-CM | POA: Diagnosis not present

## 2022-11-22 DIAGNOSIS — M7541 Impingement syndrome of right shoulder: Secondary | ICD-10-CM | POA: Diagnosis not present

## 2022-11-22 DIAGNOSIS — M65311 Trigger thumb, right thumb: Secondary | ICD-10-CM | POA: Diagnosis not present

## 2022-11-22 DIAGNOSIS — M65831 Other synovitis and tenosynovitis, right forearm: Secondary | ICD-10-CM | POA: Diagnosis not present

## 2022-11-22 DIAGNOSIS — M25522 Pain in left elbow: Secondary | ICD-10-CM | POA: Diagnosis not present

## 2022-11-30 DIAGNOSIS — E063 Autoimmune thyroiditis: Secondary | ICD-10-CM | POA: Diagnosis not present

## 2022-12-06 DIAGNOSIS — E063 Autoimmune thyroiditis: Secondary | ICD-10-CM | POA: Diagnosis not present

## 2022-12-24 ENCOUNTER — Ambulatory Visit
Admission: EM | Admit: 2022-12-24 | Discharge: 2022-12-24 | Disposition: A | Discharge status: Home / Self Care | Payer: Medicare HMO | Attending: Family Medicine | Admitting: Family Medicine

## 2022-12-24 DIAGNOSIS — R109 Unspecified abdominal pain: Secondary | ICD-10-CM

## 2022-12-24 DIAGNOSIS — M549 Dorsalgia, unspecified: Secondary | ICD-10-CM

## 2022-12-24 HISTORY — DX: Cyst of pancreas: K86.2

## 2022-12-24 LAB — POCT URINALYSIS DIP (MANUAL ENTRY)
Bilirubin, UA: NEGATIVE
Glucose, UA: NEGATIVE mg/dL
Ketones, POC UA: NEGATIVE mg/dL
Nitrite, UA: NEGATIVE
Protein Ur, POC: NEGATIVE mg/dL
Spec Grav, UA: 1.02 (ref 1.010–1.025)
Urobilinogen, UA: 0.2 U/dL
pH, UA: 7 (ref 5.0–8.0)

## 2022-12-24 MED ORDER — CYCLOBENZAPRINE HCL 5 MG PO TABS: 5.0000 mg | ORAL_TABLET | Freq: Three times a day (TID) | ORAL | 0 refills | Status: DC | PRN

## 2022-12-24 NOTE — Discharge Instructions (Signed)
I have sent over a muscle relaxer to help as needed with your back pain.  You may also use Tylenol, Voltaren gel, heat, massage, stretches.  Avoid exertional activity.  Follow-up with your primary care provider.  I have sent out a urine culture as there was a trace of bacteria in your urine, we will let you know if your urine culture is positive for a UTI and adjust medications at that time.

## 2022-12-24 NOTE — ED Triage Notes (Signed)
Pt reports back pain x 2 weeks. Pt states the pain seems to move around her back today the pain is in her lower back. Yesterday it was in her shoulder. She recalls vacuuming and thought she may have injured her back then and thought it would go away but it has seemed to have gotten worse.

## 2022-12-25 LAB — URINE CULTURE: Culture: NO GROWTH

## 2022-12-28 NOTE — ED Provider Notes (Signed)
[facility]    CSN: [id number] Arrival date & time: 12/24/22  0809      History   Chief Complaint No chief complaint on file.   HPI Kristin [name] is a 72 y.o. female.   Presenting today with 2-week history of back pain.  She states the pain moves around to different areas of the back and seem to start after she was vacuuming her house.  Occasionally feels like it moves around to the lower abdomen.  She states movement seems to make it worse, rest makes it better.  Denies chest pain, shortness of breath, nausea vomiting or diarrhea.    Past Medical History:  Diagnosis Date   Aortic atherosclerosis (HCC) 09/30/2016   Complication of anesthesia    long time to wake up 2018 with same surgery   Coronary atherosclerosis 09/30/2016   By CT findings   Dizziness    Hypothyroidism    Lumbar degenerative disc disease 09/30/2016   Maxillary sinusitis    Osteoporosis    Pancreatic cyst    PONV (postoperative nausea and vomiting)    Renal disorder    Sleep apnea    mild osa, does not use CPAP   Thyroid disease     Patient Active Problem List   Diagnosis Date Noted   Serous cystadenoma 09/24/2022   Cyst of pancreas 11/14/2020   Left flank pain 09/30/2016   LLQ pain 09/30/2016   Bilateral nephrolithiasis 09/30/2016   Lumbar degenerative disc disease 09/30/2016   Aortic atherosclerosis (HCC) 09/30/2016   Coronary atherosclerosis 09/30/2016   Pulmonary nodule 12/06/2011    Past Surgical History:  Procedure Laterality Date   BACK SURGERY  1997/1999   CARPAL TUNNEL RELEASE Right    CYSTECTOMY  1972   brreast   ESOPHAGOGASTRODUODENOSCOPY (EGD) WITH PROPOFOL N/A 02/16/2022   Procedure: ESOPHAGOGASTRODUODENOSCOPY (EGD) WITH PROPOFOL;  Surgeon: Jeani Hawking, MD;  Location: [facility];  Service: Gastroenterology;  Laterality: N/A;   FINE NEEDLE ASPIRATION N/A 02/16/2022   Procedure: FINE NEEDLE ASPIRATION (FNA) LINEAR;  Surgeon: Jeani Hawking, MD;  Location: [facility];  Service: Gastroenterology;  Laterality: N/A;   MAXILLARY ANTROSTOMY Left 08/09/2014   Procedure: ENDOSCOPIC MAXILLARY ANTROSTOMY;  Surgeon: Newman Pies, MD;  Location: [facility];  Service: ENT;  Laterality: Left;   MAXILLARY ANTROSTOMY Left 10/29/2017   Procedure: LEFT MAXILLARY ANTROSTOMY WITH TISSUE REMOVAL;  Surgeon: Newman Pies, MD;  Location: [facility];  Service: ENT;  Laterality: Left;   NASAL SINUS SURGERY Left 08/09/2014   Procedure: ENDOSCOPIC SINUS SURGERY;  Surgeon: Newman Pies, MD;  Location: [facility];  Service: ENT;  Laterality: Left;   TUBAL LIGATION  1980   UPPER ESOPHAGEAL ENDOSCOPIC ULTRASOUND (EUS) N/A 02/16/2022   Procedure: UPPER ESOPHAGEAL ENDOSCOPIC ULTRASOUND (EUS);  Surgeon: Jeani Hawking, MD;  Location: [facility];  Service: Gastroenterology;  Laterality: N/A;    OB History     Gravida  1   Para  1   Term  0   Preterm  0   AB  0   Living  0      SAB  0   IAB  0   Ectopic  0   Multiple  0   Live Births               Home Medications    Prior to Admission medications   Medication Sig Start Date End Date Taking? Authorizing Provider  cyclobenzaprine (FLEXERIL) 5 MG tablet Take 1  tablet (5 mg total) by mouth 3 (three) times daily as needed for muscle spasms. Do not drink alcohol or drive while taking this medication.  May cause drowsiness. 12/24/22  Yes Particia Nearing, PA-C  acetaminophen (TYLENOL) 500 MG tablet Take 500 mg by mouth every 6 (six) hours as needed.    [provider]  alendronate (FOSAMAX) 70 MG tablet Take 70 mg by mouth every Friday. Take with a full glass of water on an empty stomach.    [provider]  Ca Phosphate-Cholecalciferol (EQL CALCIUM GUMMIES PO) Take 1 tablet by mouth in the morning, at noon, and at bedtime.    [provider]  Cholecalciferol (VITAMIN D) 125 MCG (5000 UT) CAPS Take 5,000 Units by mouth daily with lunch.    [provider]  Coenzyme Q10 200 MG capsule Take 200 mg by mouth every evening.    [provider]  levothyroxine (SYNTHROID) 88 MCG tablet Take 88 mcg by mouth daily before breakfast.    [provider]  meclizine (ANTIVERT) 25 MG tablet Take 25 mg by mouth 3 (three) times daily as needed for dizziness.    [provider]  Omega-3 Fatty Acids (FISH OIL ULTRA) 1400 MG CAPS Take 1,400 mg by mouth daily with lunch.    [provider]  Prenatal Vit-Fe Fumarate-FA (MULTIVITAMIN-PRENATAL) 27-0.8 MG TABS tablet Take 1 tablet by mouth daily with lunch.    [provider]  rosuvastatin (CRESTOR) 40 MG tablet Take 40 mg by mouth every evening.    [provider]    Family History Family History  Problem Relation Age of Onset   Colon cancer Father    Stroke Paternal Grandfather    Cirrhosis Maternal Grandmother        liver   Hypertension Mother     Social History Social History   Tobacco Use   Smoking status: Former    Current packs/day: 0.00    Types: Cigarettes    Start date: 01/30/1968    Quit date: 01/30/1971    Years since quitting: 51.9    Passive exposure: Past   Smokeless tobacco: Never  Vaping Use   Vaping status: Never Used  Substance Use Topics   Alcohol use: No   Drug use: No     Allergies   Bee venom, Hydrocodone-acetaminophen, Penicillins, and Sulfa antibiotics   Review of Systems Review of Systems Per HPI  Physical Exam Triage Vital Signs ED Triage Vitals  Encounter Vitals Group     BP 12/24/22 0833 (!) 153/84     Systolic BP Percentile --      Diastolic BP Percentile --      Pulse Rate 12/24/22 0833 71     Resp 12/24/22 0833 19     Temp 12/24/22 0833 97.8 F (36.6 C)     Temp Source 12/24/22 0833 Oral     SpO2 12/24/22 0833 95 %     Weight --      Height --      Head Circumference --      Peak Flow --      Pain Score 12/24/22 0835 8     Pain Loc --      Pain Education --      Exclude from  Growth Chart --    No data found.  Updated Vital Signs BP (!) 153/84 (BP Location: Right Arm)   Pulse 71   Temp 97.8 F (36.6 C) (Oral)   Resp 19  SpO2 95%   Visual Acuity Right Eye Distance:   Left Eye Distance:   Bilateral Distance:    Right Eye Near:   Left Eye Near:    Bilateral Near:     Physical Exam   UC Treatments / Results  Labs (all labs ordered are listed, but only abnormal results are displayed) Labs Reviewed  POCT URINALYSIS DIP (MANUAL ENTRY) - Abnormal; Notable for the following components:      Result Value   Blood, UA trace-intact (*)    Leukocytes, UA Trace (*)    All other components within normal limits  URINE CULTURE    EKG   Radiology No results found.  Procedures Procedures (including critical care time)  Medications Ordered in UC Medications - No data to display  Initial Impression / Assessment and Plan / UC Course  I have reviewed the triage vital signs and the nursing notes.  Pertinent labs & imaging results that were available during my care of the patient were reviewed by me and considered in my medical decision making (see chart for details).     Mildly hypertensive in triage, otherwise vital signs within normal limits.  Urinalysis today with trace leuks and trace blood but given reproducible pain and pain worse with movement very low suspicion for kidney stone or kidney infection.  Suspect muscular cause of pain.  Treat with Flexeril, heat, massage, stretches, rest.  Return for worsening symptoms.  Final Clinical Impressions(s) / UC Diagnoses   Final diagnoses:  Left flank pain  Acute bilateral back pain, unspecified back location     Discharge Instructions      I have sent over a muscle relaxer to help as needed with your back pain.  You may also use Tylenol, Voltaren gel, heat, massage, stretches.  Avoid exertional activity.  Follow-up with your primary care provider.  I have sent out a urine culture as there was a  trace of bacteria in your urine, we will let you know if your urine culture is positive for a UTI and adjust medications at that time.     ED Prescriptions     Medication Sig Dispense Auth. Provider   cyclobenzaprine (FLEXERIL) 5 MG tablet Take 1 tablet (5 mg total) by mouth 3 (three) times daily as needed for muscle spasms. Do not drink alcohol or drive while taking this medication.  May cause drowsiness. 15 tablet Particia Nearing, [place]      PDMP not reviewed this encounter.   Particia Nearing, [place] 12/28/22 1943

## 2023-01-04 DIAGNOSIS — H524 Presbyopia: Secondary | ICD-10-CM | POA: Diagnosis not present

## 2023-01-04 DIAGNOSIS — D3131 Benign neoplasm of right choroid: Secondary | ICD-10-CM | POA: Diagnosis not present

## 2023-01-31 ENCOUNTER — Ambulatory Visit (HOSPITAL_COMMUNITY)
Admission: RE | Admit: 2023-01-31 | Discharge: 2023-01-31 | Disposition: A | Discharge status: Home / Self Care | Payer: HMO | Source: Ambulatory Visit | Attending: Internal Medicine | Admitting: Internal Medicine

## 2023-01-31 ENCOUNTER — Other Ambulatory Visit (HOSPITAL_COMMUNITY): Payer: Self-pay | Admitting: Internal Medicine

## 2023-01-31 DIAGNOSIS — M25511 Pain in right shoulder: Secondary | ICD-10-CM

## 2023-01-31 DIAGNOSIS — M19011 Primary osteoarthritis, right shoulder: Secondary | ICD-10-CM | POA: Diagnosis not present

## 2023-01-31 DIAGNOSIS — I700 Atherosclerosis of aorta: Secondary | ICD-10-CM | POA: Diagnosis not present

## 2023-01-31 DIAGNOSIS — R0789 Other chest pain: Secondary | ICD-10-CM | POA: Diagnosis not present

## 2023-01-31 DIAGNOSIS — M545 Low back pain, unspecified: Secondary | ICD-10-CM | POA: Diagnosis not present

## 2023-01-31 DIAGNOSIS — R079 Chest pain, unspecified: Secondary | ICD-10-CM | POA: Diagnosis not present

## 2023-02-05 ENCOUNTER — Other Ambulatory Visit: Payer: Self-pay | Admitting: Obstetrics and Gynecology

## 2023-02-05 DIAGNOSIS — Z1231 Encounter for screening mammogram for malignant neoplasm of breast: Secondary | ICD-10-CM

## 2023-02-06 ENCOUNTER — Other Ambulatory Visit: Payer: Self-pay | Admitting: Obstetrics and Gynecology

## 2023-02-06 DIAGNOSIS — M810 Age-related osteoporosis without current pathological fracture: Secondary | ICD-10-CM

## 2023-02-14 DIAGNOSIS — M7541 Impingement syndrome of right shoulder: Secondary | ICD-10-CM | POA: Diagnosis not present

## 2023-02-14 DIAGNOSIS — M65831 Other synovitis and tenosynovitis, right forearm: Secondary | ICD-10-CM | POA: Diagnosis not present

## 2023-02-14 DIAGNOSIS — M25562 Pain in left knee: Secondary | ICD-10-CM | POA: Diagnosis not present

## 2023-02-14 DIAGNOSIS — M65311 Trigger thumb, right thumb: Secondary | ICD-10-CM | POA: Diagnosis not present

## 2023-03-11 DIAGNOSIS — M25562 Pain in left knee: Secondary | ICD-10-CM | POA: Diagnosis not present

## 2023-03-14 DIAGNOSIS — M13862 Other specified arthritis, left knee: Secondary | ICD-10-CM | POA: Diagnosis not present

## 2023-03-14 DIAGNOSIS — S83242D Other tear of medial meniscus, current injury, left knee, subsequent encounter: Secondary | ICD-10-CM | POA: Diagnosis not present

## 2023-03-26 ENCOUNTER — Ambulatory Visit (INDEPENDENT_AMBULATORY_CARE_PROVIDER_SITE_OTHER): Payer: Medicare HMO | Admitting: Gastroenterology

## 2023-03-26 ENCOUNTER — Ambulatory Visit (INDEPENDENT_AMBULATORY_CARE_PROVIDER_SITE_OTHER): Payer: PPO | Admitting: Gastroenterology

## 2023-03-26 ENCOUNTER — Encounter (INDEPENDENT_AMBULATORY_CARE_PROVIDER_SITE_OTHER): Payer: Self-pay | Admitting: Gastroenterology

## 2023-03-26 VITALS — BP 135/83 | HR 70 | Temp 98.0°F | Ht 61.0 in | Wt 128.0 lb

## 2023-03-26 DIAGNOSIS — K869 Disease of pancreas, unspecified: Secondary | ICD-10-CM

## 2023-03-26 DIAGNOSIS — K862 Cyst of pancreas: Secondary | ICD-10-CM

## 2023-03-26 NOTE — Progress Notes (Addendum)
 Referring Provider: Assunta Found, MD Primary Care Physician:  Assunta Found, MD Primary GI Physician: Dr. Levon Hedger   Chief Complaint  Patient presents with   Follow-up    Patient here today for a follow up on Cyst of pancreas. The patient denies any current gi issues.    HPI:   OCTAVIA VELADOR is a 73 y.o. female with past medical history of atherosclerosis, hypothyroidism, osteoporosis, OSA , pancreatic lesion   Patient presenting today for follow up of pancreatic lesion  Last seen August 2024, at that time had no GI complaints  Patient recommended to have possible evaluation at [facility] for repeat EUS vs surveillance with MRCP in jan 2025, repeat TCS nov 2026  Present: She reports some occasional abdominal discomfort in upper abdomen. She notes that she started having intermittent pain after her last OV. She notes that pain has become less frequent and has only had this maybe 1-2 times over the past few months. No nausea or vomiting. No changes in appetite or weight loss.   She has upcoming labs with PCP in April.  Patient is requesting to not be referred to [facility] if she needs further EUS due to difficulty with transportation there.    Pertinent history:  case was discussed with Dr. Christella Hartigan who considered that given that the lesion has been stable and did not have criteria for invasive testing, he recommended clinical surveillance or repeating abdominal imaging.  She underwent an MRI of the abdomen and without IV contrast on 07/15/2019 which showed a stable 2.8 x 2.6 cm multicystic lesion in the head of the pancreas without main duct dilation which was very similar to a PET scan from 2013 measuring 2.2 x 2.1 cm.      Repeat MRI in November 2022 with Stable multilobulated cystic mass in the head of the pancreas measuring 2.7 cm.   MRCP done 11/2021 with enlargement of pancreatic cystic mass measuring 3.9x 3.1 x3.5 cm, concerning for growing cystoadenoma.     EUS on 02/16/2022 by  Dr. Jeani Hawking at [facility]. Report showed: - Anechoic lesions suggestive of multiple cysts were identified in the pancreatic head. Diagnostic needle aspiration for fluid was performed. Color Doppler imaging was utilized prior to needle puncture to confirm a lack of significant vascular structures within the needle path. One pass was made with the 22 gauge needle using a transduodenal approach. A stylet was used. The amount of fluid collected was 2 mL. The fluid was white, blood- tinged, thin and watery. Sample( s) were sent for amylase concentration, lipase, cytology and CEA.   In the head of the pancreas a multicystic lesion was identified with the possibility of a solid component. At its largest dimension the lesion measured 3. 9 cm x 2. 8 cm. Several larger cysts were near the tip of the echoendoscope and fluid was able to aspirated. The fluid was clear in the aspiration syringe and it was thin/ watery. From the FNA needle there was evidence of blood tinged fluid. The lesion exhibited many coursing blood vessels. A safe window for further aspiration and FNA of the possible solid component was not possible during this examination. If a repeat EUS with FNA is necessary a small window does exist at Station 4. At Station 3 there were a couple of small windows where the vasculature was minimal. Ciprofloxoacin was provided for her during the procedure. The CBD was normal in appearance and caliber. A confident view of the PD in the pancreatic head  was not obtained, but there was no upstream PD dilation.  Unfortunately, no results of fluid analysis are available.   MRCP ordered by Dr. Loreta Ave,  08/22/2022.  This showed a 3.3 cm complex cystic mass in the pancreatic head still consistent with a serous cystadenoma.  There was stable mild pancreatic ductal dilation.    Last Colonoscopy: had colonoscopy with Dr. Loreta Ave in November 2023, normal per patient.  Recommended to repeat every 3 years - brother and  father had colon cancer   Past Medical History:  Diagnosis Date   Aortic atherosclerosis (HCC) 09/30/2016   Complication of anesthesia    long time to wake up 2018 with same surgery   Coronary atherosclerosis 09/30/2016   By CT findings   Dizziness    Hypothyroidism    Lumbar degenerative disc disease 09/30/2016   Maxillary sinusitis    Osteoporosis    Pancreatic cyst    PONV (postoperative nausea and vomiting)    Renal disorder    Sleep apnea    mild osa, does not use CPAP   Thyroid disease     Past Surgical History:  Procedure Laterality Date   BACK SURGERY  1997/1999   CARPAL TUNNEL RELEASE Right    CYSTECTOMY  1972   brreast   ESOPHAGOGASTRODUODENOSCOPY (EGD) WITH PROPOFOL N/A 02/16/2022   Procedure: ESOPHAGOGASTRODUODENOSCOPY (EGD) WITH PROPOFOL;  Surgeon: Jeani Hawking, MD;  Location: [facility];  Service: Gastroenterology;  Laterality: N/A;   FINE NEEDLE ASPIRATION N/A 02/16/2022   Procedure: FINE NEEDLE ASPIRATION (FNA) LINEAR;  Surgeon: Jeani Hawking, MD;  Location: [facility];  Service: Gastroenterology;  Laterality: N/A;   MAXILLARY ANTROSTOMY Left 08/09/2014   Procedure: ENDOSCOPIC MAXILLARY ANTROSTOMY;  Surgeon: Newman Pies, MD;  Location: [facility];  Service: ENT;  Laterality: Left;   MAXILLARY ANTROSTOMY Left 10/29/2017   Procedure: LEFT MAXILLARY ANTROSTOMY WITH TISSUE REMOVAL;  Surgeon: Newman Pies, MD;  Location: [facility];  Service: ENT;  Laterality: Left;   NASAL SINUS SURGERY Left 08/09/2014   Procedure: ENDOSCOPIC SINUS SURGERY;  Surgeon: Newman Pies, MD;  Location: [facility];  Service: ENT;  Laterality: Left;   TUBAL LIGATION  1980   UPPER ESOPHAGEAL ENDOSCOPIC ULTRASOUND (EUS) N/A 02/16/2022   Procedure: UPPER ESOPHAGEAL ENDOSCOPIC ULTRASOUND (EUS);  Surgeon: Jeani Hawking, MD;  Location: [facility];  Service: Gastroenterology;  Laterality: N/A;    Current Outpatient Medications  Medication Sig Dispense Refill    acetaminophen (TYLENOL) 500 MG tablet Take 500 mg by mouth every 6 (six) hours as needed.     alendronate (FOSAMAX) 70 MG tablet Take 70 mg by mouth every Friday. Take with a full glass of water on an empty stomach.     calcium carbonate (OSCAL) 1500 (600 Ca) MG TABS tablet Take 1,500 mg by mouth daily with breakfast.     Cholecalciferol (VITAMIN D) 125 MCG (5000 UT) CAPS Take 5,000 Units by mouth daily with lunch.     Coenzyme Q10 200 MG capsule Take 200 mg by mouth every evening.     levothyroxine (SYNTHROID) 88 MCG tablet Take 88 mcg by mouth daily before breakfast.     meclizine (ANTIVERT) 25 MG tablet Take 25 mg by mouth 3 (three) times daily as needed for dizziness.     Omega-3 Fatty Acids (FISH OIL ULTRA) 1400 MG CAPS Take 1,400 mg by mouth daily with lunch.     rosuvastatin (CRESTOR) 40 MG tablet Take 40 mg by mouth every evening.  No current facility-administered medications for this visit.    Allergies as of 03/26/2023 - Review Complete 03/26/2023  Allergen Reaction Noted   Bee venom Anaphylaxis 09/30/2016   Hydrocodone-acetaminophen Other (See Comments) 02/25/2019   Penicillins Swelling 12/06/2011   Sulfa antibiotics  04/01/2021    Family History  Problem Relation Age of Onset   Colon cancer Father    Stroke Paternal Grandfather    Cirrhosis Maternal Grandmother        liver   Hypertension Mother     Social History   Socioeconomic History   Marital status: Married    Spouse name: Not on file   Number of children: 1   Years of education: Not on file   Highest education level: Not on file  Occupational History    Employer: [employer]  Tobacco Use   Smoking status: Former    Current packs/day: 0.00    Types: Cigarettes    Start date: 01/30/1968    Quit date: 01/30/1971    Years since quitting: 52.1    Passive exposure: Past   Smokeless tobacco: Never  Vaping Use   Vaping status: Never Used  Substance and Sexual Activity   Alcohol use: No   Drug  use: No   Sexual activity: Yes    Birth control/protection: Surgical    Comment: BTL  Other Topics Concern   Not on file  Social History Narrative   Not on file   Social Drivers of Health   Financial Resource Strain: Not on file  Food Insecurity: Low Risk  (12/06/2022)   Received from [facility]   Hunger Vital Sign    Worried About Running Out of Food in the Last Year: Never true    Ran Out of Food in the Last Year: Never true  Transportation Needs: No Transportation Needs (12/06/2022)   Received from [facility]    In the past 12 months, has lack of reliable transportation kept you from medical appointments, meetings, work or from getting things needed for daily living? : No  Physical Activity: Not on file  Stress: Not on file  Social Connections: Not on file    Review of systems General: negative for malaise, night sweats, fever, chills, weight loss Neck: Negative for lumps, goiter, pain and significant neck swelling Resp: Negative for cough, wheezing, dyspnea at rest CV: Negative for chest pain, leg swelling, palpitations, orthopnea GI: denies melena, hematochezia, nausea, vomiting, diarrhea, constipation, dysphagia, odyonophagia, early satiety or unintentional weight loss. +occasional upper abdominal pain  The remainder of the review of systems is noncontributory.  Physical Exam: BP 135/83 (BP Location: Left Arm, Patient Position: Sitting, Cuff Size: Normal)   Pulse 70   Temp 98 F (36.7 C) (Temporal)   Ht 5\' 1"  (1.549 m)   Wt 128 lb (58.1 kg)   BMI 24.19 kg/m  General:   Alert and oriented. No distress noted. Pleasant and cooperative.  Head:  Normocephalic and atraumatic. Eyes:  Conjuctiva clear without scleral icterus. Mouth:  Oral mucosa pink and moist. Good dentition. No lesions. Heart: Normal rate and rhythm, s1 and s2 heart sounds present.  Lungs: Clear lung sounds in all lobes. Respirations equal and unlabored. Abdomen:  +BS, soft,  non-tender and non-distended. No rebound or guarding. No HSM or masses noted. Neurologic:  Alert and  oriented x4 Psych:  Alert and cooperative. Normal mood and affect.  Invalid input(s): "6 MONTHS"   ASSESSMENT: NAIDA ESCALANTE is a 73 y.o. female presenting  today for follow up of pancreatic lesion   Patient with presence of pancreatic lesions for many years which has grown in size with max siz of 3.9cm in 2023 for which she underwent EUS, EUS showed changes possibly suggestive of serous cystadenoma, however, unfortunately sampling of the possible solid component was difficult given vascularity of the lesion. It appears that Dr. Levon Hedger attempted to obtain fluid analysis done by Dr. Elnoria Howard to help determine if there was a mucinous component in the cyst, though it is unclear if these records were ever obtained for review. Will need to discuss case further with Dr. Levon Hedger and possibly attempt to obtain these records again to determine if patient will need repeat EUS or if we can continue surveillance via MRCP. She notes some occasional upper abdominal discomfort though reports she has not experienced this in the past few months, otherwise she remains asymptomatic and has not developed any presence of DM. Last MRCP done 07/2022 showed decrease in size of lesion.    PLAN:  -discuss repeat EUS Vs. MRCP w wo with Dr. Levon Hedger -repeat TCS nov 2026  All questions were answered, patient verbalized understanding and is in agreement with plan as outlined above.    Follow Up: 6 months   Kynadi Dragos L. Jeanmarie Hubert, MSN, APRN, AGNP-C Adult-Gerontology Nurse Practitioner [facility]  I have reviewed the note and agree with the APP's assessment as described in this progress note  Reviewed results of fluid analysis from endoscopic ultrasound performed in January 2024.  This procedure showed anechoic lesions in the pancreatic head but there was presence of a possibility of a solid component.   Cluster of lesions had largest size of 3.9 cm x 2.8 cm.  Cytology did not show any atypia only showing mesothelial cells.  However, FNA of possible solid component was not possible during the current examination. Results of amylase, lipase and CEA were not faxed, so characterization (mucionous vs serous) cannot be performed.  Based on imaging, it appears that the appearance of the lesion is a cystadenoma but characerization of mucinous component and FNA/FNB is imperative. We will discuss with the patietn if she is open to repeat EUS at [facility].  Katrinka Blazing, MD Gastroenterology and Hepatology [facility]

## 2023-03-26 NOTE — Patient Instructions (Signed)
 I will discuss your case with Dr. Levon Hedger to determine if we can do re imaging or if he thinks we need to do repeat EUS. I will be in touch after I have spoken with him  Follow up 6 months  It was a pleasure to see you today. I want to create trusting relationships with patients and provide genuine, compassionate, and quality care. I truly value your feedback! please be on the lookout for a survey regarding your visit with me today. I appreciate your input about our visit and your time in completing this!    Jasminne Mealy L. Jeanmarie Hubert, MSN, APRN, AGNP-C Adult-Gerontology Nurse Practitioner [facility] Gastroenterology at [facility]

## 2023-04-04 ENCOUNTER — Encounter (INDEPENDENT_AMBULATORY_CARE_PROVIDER_SITE_OTHER): Payer: Self-pay

## 2023-04-04 NOTE — Progress Notes (Signed)
 Referral sent, they will contact patient with apt

## 2023-04-05 ENCOUNTER — Telehealth (INDEPENDENT_AMBULATORY_CARE_PROVIDER_SITE_OTHER): Payer: Self-pay | Admitting: *Deleted

## 2023-04-05 NOTE — Telephone Encounter (Signed)
 Rec'd message from [facility] at [facility] regarding referral for EUS w/ FNA - she states after looking through the note she sees where patient didn't want to be referral back to [facility] - I don't see that in last note - please advise

## 2023-04-08 NOTE — Telephone Encounter (Signed)
 I reread Kristin [name]'s note from 03/26/23 ore carefully, and she does have in there  Patient is requesting to not be referred to [facility] if she needs further EUS due to difficulty with transportation there.   Do you want me to refer her to [facility]

## 2023-04-08 NOTE — Telephone Encounter (Signed)
 Yes please, lets see if this is easier for her

## 2023-04-08 NOTE — Telephone Encounter (Signed)
 Information sent to [facility]

## 2023-04-08 NOTE — Telephone Encounter (Signed)
 Thank you :)

## 2023-04-09 ENCOUNTER — Telehealth: Payer: Self-pay

## 2023-04-09 NOTE — Telephone Encounter (Signed)
-----   Message from [facility] sent at 04/09/2023  4:26 PM EDT ----- Due to the reported PD dilation, I do think repeat EUS is reasonable. Reading the EUS report, sounds like it may be difficult for [place] to get a sample but we can certainly attempt based on what it looks like to decide on sampling via aspiration or needle biopsy. I recommend referring office obtain CA 19-9 as well as an updated CMP and I will be able to review those in the future since they are part of the [facility] system. I am okay with moving forward with upper EUS.  Team, Please proceed with scheduling upper EUS in the next 2 to 3 months as available for pancreatic cyst. Thanks. GM ----- Message ----- From: Loretha Stapler, RN Sent: 04/08/2023  10:13 AM EDT To: Larose Kells., MD   ----- Message ----- From: Simone Curia Sent: 04/08/2023   9:57 AM EDT To: Loretha Stapler, RN; #  Per Dr Levon Hedger, patient needs EUS w/ FNA - Dx: pancreatic cystic lesion. thanks

## 2023-04-10 ENCOUNTER — Other Ambulatory Visit: Payer: Self-pay

## 2023-04-10 ENCOUNTER — Telehealth (INDEPENDENT_AMBULATORY_CARE_PROVIDER_SITE_OTHER): Payer: Self-pay

## 2023-04-10 ENCOUNTER — Other Ambulatory Visit (INDEPENDENT_AMBULATORY_CARE_PROVIDER_SITE_OTHER): Payer: Self-pay

## 2023-04-10 DIAGNOSIS — I25119 Atherosclerotic heart disease of native coronary artery with unspecified angina pectoris: Secondary | ICD-10-CM | POA: Diagnosis not present

## 2023-04-10 DIAGNOSIS — I700 Atherosclerosis of aorta: Secondary | ICD-10-CM

## 2023-04-10 DIAGNOSIS — N200 Calculus of kidney: Secondary | ICD-10-CM | POA: Diagnosis not present

## 2023-04-10 DIAGNOSIS — R1032 Left lower quadrant pain: Secondary | ICD-10-CM | POA: Diagnosis not present

## 2023-04-10 DIAGNOSIS — R911 Solitary pulmonary nodule: Secondary | ICD-10-CM

## 2023-04-10 DIAGNOSIS — R109 Unspecified abdominal pain: Secondary | ICD-10-CM

## 2023-04-10 DIAGNOSIS — K862 Cyst of pancreas: Secondary | ICD-10-CM

## 2023-04-10 DIAGNOSIS — D279 Benign neoplasm of unspecified ovary: Secondary | ICD-10-CM

## 2023-04-10 DIAGNOSIS — M5136 Other intervertebral disc degeneration, lumbar region with discogenic back pain only: Secondary | ICD-10-CM

## 2023-04-10 NOTE — Telephone Encounter (Signed)
 Patient notified to go to [facility] for labwork.

## 2023-04-10 NOTE — Telephone Encounter (Signed)
 EUS has been added to 05/30/23 at 8 am at [facility] with GM

## 2023-04-10 NOTE — Telephone Encounter (Signed)
 Kristin Frame, MD  Kristin [name], Kristin Starring., MD; Kristin James, NP; Kristin [name], CMA Thanks [name]. [name], please ask the patient to have CMP and CA 19-9 drawn -please send her the orders. Thanks

## 2023-04-10 NOTE — Telephone Encounter (Signed)
 I have placed the labs in Epic for the patient to have her labs drawn at [facility]. I have left a message on patient vm, asking that She return call to the office.

## 2023-04-11 LAB — COMPREHENSIVE METABOLIC PANEL
ALT: 13 IU/L (ref 0–32)
AST: 22 IU/L (ref 0–40)
Albumin: 4.6 g/dL (ref 3.8–4.8)
Alkaline Phosphatase: 65 IU/L (ref 44–121)
BUN/Creatinine Ratio: 28 (ref 12–28)
BUN: 22 mg/dL (ref 8–27)
Bilirubin Total: 0.2 mg/dL (ref 0.0–1.2)
CO2: 24 mmol/L (ref 20–29)
Calcium: 9.8 mg/dL (ref 8.7–10.3)
Chloride: 104 mmol/L (ref 96–106)
Creatinine, Ser: 0.8 mg/dL (ref 0.57–1.00)
Globulin, Total: 2.3 g/dL (ref 1.5–4.5)
Glucose: 202 mg/dL — ABNORMAL HIGH (ref 70–99)
Potassium: 4.6 mmol/L (ref 3.5–5.2)
Sodium: 142 mmol/L (ref 134–144)
Total Protein: 6.9 g/dL (ref 6.0–8.5)
eGFR: 77 mL/min/{1.73_m2} (ref >59)

## 2023-04-11 LAB — CANCER ANTIGEN 19-9: CA 19-9: 40 U/mL — ABNORMAL HIGH (ref 0–35)

## 2023-04-11 NOTE — Telephone Encounter (Signed)
 Left message on machine to call back

## 2023-04-11 NOTE — Telephone Encounter (Signed)
 EUS scheduled, pt instructed and medications reviewed.  Patient instructions mailed to home.  Patient to call with any questions or concerns.

## 2023-04-19 ENCOUNTER — Ambulatory Visit
Admission: RE | Admit: 2023-04-19 | Discharge: 2023-04-19 | Disposition: A | Discharge status: Home / Self Care | Source: Ambulatory Visit | Attending: Obstetrics and Gynecology | Admitting: Obstetrics and Gynecology

## 2023-04-19 DIAGNOSIS — Z1231 Encounter for screening mammogram for malignant neoplasm of breast: Secondary | ICD-10-CM

## 2023-04-25 DIAGNOSIS — Z01419 Encounter for gynecological examination (general) (routine) without abnormal findings: Secondary | ICD-10-CM | POA: Diagnosis not present

## 2023-04-25 DIAGNOSIS — M810 Age-related osteoporosis without current pathological fracture: Secondary | ICD-10-CM | POA: Diagnosis not present

## 2023-04-25 DIAGNOSIS — Z1211 Encounter for screening for malignant neoplasm of colon: Secondary | ICD-10-CM | POA: Diagnosis not present

## 2023-04-25 DIAGNOSIS — Z6823 Body mass index (BMI) 23.0-23.9, adult: Secondary | ICD-10-CM | POA: Diagnosis not present

## 2023-04-25 DIAGNOSIS — Z1231 Encounter for screening mammogram for malignant neoplasm of breast: Secondary | ICD-10-CM | POA: Diagnosis not present

## 2023-05-01 DIAGNOSIS — S83242A Other tear of medial meniscus, current injury, left knee, initial encounter: Secondary | ICD-10-CM | POA: Diagnosis not present

## 2023-05-01 DIAGNOSIS — M65311 Trigger thumb, right thumb: Secondary | ICD-10-CM | POA: Diagnosis not present

## 2023-05-01 DIAGNOSIS — M7541 Impingement syndrome of right shoulder: Secondary | ICD-10-CM | POA: Diagnosis not present

## 2023-05-20 DIAGNOSIS — Z0000 Encounter for general adult medical examination without abnormal findings: Secondary | ICD-10-CM | POA: Diagnosis not present

## 2023-05-22 ENCOUNTER — Telehealth: Payer: Self-pay | Admitting: Gastroenterology

## 2023-05-22 NOTE — Telephone Encounter (Signed)
 Patient called and stated that she received a call from our office. Patient was informed of he procedure date and time. Patient stated that she was not aware of her having to do any kind of prep. Patient is requesting we reach out to here regarding her prep. Please advise.

## 2023-05-22 NOTE — Telephone Encounter (Addendum)
 Procedure:Upper Endoscopic Ultrasound (EUS) Procedure date: 05/30/23 Procedure location: [facility] Arrival Time: 6:30 am Spoke with the patient Y/N:   No, I left a detailed message for the patient to return call  05/22/23 @ 1:36 pm  Yes, 05/22/23 @ 3:55 pm  Any prep concerns? No Has the patient obtained the prep from the pharmacy ? No prep needed Do you have a care partner and transportation: Yes Any additional concerns? No

## 2023-05-23 ENCOUNTER — Encounter (HOSPITAL_COMMUNITY): Payer: Self-pay | Admitting: Gastroenterology

## 2023-05-28 DIAGNOSIS — E785 Hyperlipidemia, unspecified: Secondary | ICD-10-CM | POA: Diagnosis not present

## 2023-05-28 DIAGNOSIS — R7309 Other abnormal glucose: Secondary | ICD-10-CM | POA: Diagnosis not present

## 2023-05-28 DIAGNOSIS — E039 Hypothyroidism, unspecified: Secondary | ICD-10-CM | POA: Diagnosis not present

## 2023-05-29 NOTE — Anesthesia Preprocedure Evaluation (Addendum)
 Anesthesia Evaluation  Patient identified by MRN, date of birth, ID band Patient awake    Reviewed: Allergy & Precautions, NPO status , Patient's Chart, lab work & pertinent test results  History of Anesthesia Complications (+) PONV  Airway Mallampati: II  TM Distance: >3 FB Neck ROM: Full    Dental  (+) Dental Advisory Given   Pulmonary sleep apnea (does not require CPAP) , former smoker   breath sounds clear to auscultation       Cardiovascular + CAD (calcifications on CT)   Rhythm:Regular Rate:Normal     Neuro/Psych negative neurological ROS     GI/Hepatic negative GI ROS, Neg liver ROS,,,  Endo/Other  Hypothyroidism    Renal/GU Renal InsufficiencyRenal disease     Musculoskeletal   Abdominal   Peds  Hematology negative hematology ROS (+)   Anesthesia Other Findings   Reproductive/Obstetrics                             Anesthesia Physical Anesthesia Plan  ASA: 3  Anesthesia Plan: MAC   Post-op Pain Management: Minimal or no pain anticipated   Induction:   PONV Risk Score and Plan: 3 and Treatment may vary due to age or medical condition  Airway Management Planned: Natural Airway and Simple Face Mask  Additional Equipment: None  Intra-op Plan:   Post-operative Plan:   Informed Consent: I have reviewed the patients History and Physical, chart, labs and discussed the procedure including the risks, benefits and alternatives for the proposed anesthesia with the patient or authorized representative who has indicated his/her understanding and acceptance.     Dental advisory given  Plan Discussed with: CRNA and Surgeon  Anesthesia Plan Comments:        Anesthesia Quick Evaluation

## 2023-05-30 ENCOUNTER — Ambulatory Visit (HOSPITAL_BASED_OUTPATIENT_CLINIC_OR_DEPARTMENT_OTHER): Payer: Self-pay | Admitting: Anesthesiology

## 2023-05-30 ENCOUNTER — Ambulatory Visit (HOSPITAL_COMMUNITY)
Admission: RE | Admit: 2023-05-30 | Discharge: 2023-05-30 | Disposition: A | Discharge status: Home / Self Care | Attending: Gastroenterology | Admitting: Gastroenterology

## 2023-05-30 ENCOUNTER — Ambulatory Visit (HOSPITAL_COMMUNITY): Payer: Self-pay | Admitting: Anesthesiology

## 2023-05-30 ENCOUNTER — Encounter (HOSPITAL_COMMUNITY): Payer: Self-pay | Admitting: Gastroenterology

## 2023-05-30 ENCOUNTER — Other Ambulatory Visit: Payer: Self-pay

## 2023-05-30 ENCOUNTER — Encounter (HOSPITAL_COMMUNITY)
Admission: RE | Disposition: A | Discharge status: Home / Self Care | Payer: Self-pay | Source: Home / Self Care | Attending: Gastroenterology

## 2023-05-30 DIAGNOSIS — I899 Noninfective disorder of lymphatic vessels and lymph nodes, unspecified: Secondary | ICD-10-CM | POA: Diagnosis not present

## 2023-05-30 DIAGNOSIS — K869 Disease of pancreas, unspecified: Secondary | ICD-10-CM

## 2023-05-30 DIAGNOSIS — K838 Other specified diseases of biliary tract: Secondary | ICD-10-CM

## 2023-05-30 DIAGNOSIS — K862 Cyst of pancreas: Secondary | ICD-10-CM | POA: Insufficient documentation

## 2023-05-30 DIAGNOSIS — K3189 Other diseases of stomach and duodenum: Secondary | ICD-10-CM | POA: Diagnosis not present

## 2023-05-30 DIAGNOSIS — E039 Hypothyroidism, unspecified: Secondary | ICD-10-CM | POA: Insufficient documentation

## 2023-05-30 DIAGNOSIS — I2510 Atherosclerotic heart disease of native coronary artery without angina pectoris: Secondary | ICD-10-CM | POA: Insufficient documentation

## 2023-05-30 DIAGNOSIS — Z87891 Personal history of nicotine dependence: Secondary | ICD-10-CM | POA: Diagnosis not present

## 2023-05-30 DIAGNOSIS — K2289 Other specified disease of esophagus: Secondary | ICD-10-CM | POA: Diagnosis not present

## 2023-05-30 DIAGNOSIS — K2970 Gastritis, unspecified, without bleeding: Secondary | ICD-10-CM

## 2023-05-30 DIAGNOSIS — K8689 Other specified diseases of pancreas: Secondary | ICD-10-CM

## 2023-05-30 DIAGNOSIS — D175 Benign lipomatous neoplasm of intra-abdominal organs: Secondary | ICD-10-CM

## 2023-05-30 DIAGNOSIS — G4730 Sleep apnea, unspecified: Secondary | ICD-10-CM | POA: Insufficient documentation

## 2023-05-30 HISTORY — PX: EUS: SHX5427

## 2023-05-30 HISTORY — PX: ESOPHAGOGASTRODUODENOSCOPY: SHX5428

## 2023-05-30 HISTORY — PX: FINE NEEDLE ASPIRATION: SHX6590

## 2023-05-30 SURGERY — ULTRASOUND, UPPER GI TRACT, ENDOSCOPIC
Anesthesia: Monitor Anesthesia Care

## 2023-05-30 MED ORDER — CIPROFLOXACIN HCL 500 MG PO TABS: 500.0000 mg | ORAL_TABLET | Freq: Two times a day (BID) | ORAL | 0 refills | Status: AC

## 2023-05-30 MED ORDER — SODIUM CHLORIDE 0.9 % IV SOLN: INTRAVENOUS | Status: DC

## 2023-05-30 MED ORDER — CIPROFLOXACIN IN D5W 400 MG/200ML IV SOLN
INTRAVENOUS | Status: DC | PRN
Start: 2023-05-30 — End: 2023-05-30
  Administered 2023-05-30: 400 mg via INTRAVENOUS

## 2023-05-30 MED ORDER — CIPROFLOXACIN IN D5W 400 MG/200ML IV SOLN
INTRAVENOUS | Status: AC
  Filled 2023-05-30: qty 200

## 2023-05-30 MED ORDER — LIDOCAINE 2% (20 MG/ML) 5 ML SYRINGE
INTRAMUSCULAR | Status: DC | PRN
  Administered 2023-05-30: 80 mg via INTRAVENOUS

## 2023-05-30 MED ORDER — PROPOFOL 1000 MG/100ML IV EMUL
INTRAVENOUS | Status: AC
  Filled 2023-05-30: qty 100

## 2023-05-30 MED ORDER — PROPOFOL 500 MG/50ML IV EMUL
INTRAVENOUS | Status: DC | PRN
  Administered 2023-05-30: 20 mg via INTRAVENOUS
  Administered 2023-05-30: 130 ug/kg/min via INTRAVENOUS
  Administered 2023-05-30: 20 mg via INTRAVENOUS

## 2023-05-30 MED ORDER — SODIUM CHLORIDE 0.9 % IV SOLN
INTRAVENOUS | Status: AC | PRN
  Administered 2023-05-30: 500 mL via INTRAMUSCULAR

## 2023-05-30 MED ORDER — PROPOFOL 500 MG/50ML IV EMUL
INTRAVENOUS | Status: AC
  Filled 2023-05-30: qty 50

## 2023-05-30 NOTE — Discharge Instructions (Signed)

## 2023-05-30 NOTE — Op Note (Signed)
 [facility] Patient Name: Kristin [name] Procedure Date: 05/30/2023 MRN: [id number] Attending MD: Yong Henle , MD, [id number] Date of Birth: [birth year] CSN: [id number] Age: 73 Admit Type: Outpatient Procedure:                Upper EUS Indications:              Pancreatic cyst on MRCP Providers:                Yong Henle, MD, Bradley Caffey, Marvelyn Slim, Technician Referring MD:              Medicines:                Monitored Anesthesia Care Complications:            No immediate complications. Estimated Blood Loss:     Estimated blood loss was minimal. Procedure:                Pre-Anesthesia Assessment:                           - Prior to the procedure, a History and Physical                            was performed, and patient medications and                            allergies were reviewed. The patient's tolerance of                            previous anesthesia was also reviewed. The risks                            and benefits of the procedure and the sedation                            options and risks were discussed with the patient.                            All questions were answered, and informed consent                            was obtained. Prior Anticoagulants: The patient has                            taken no anticoagulant or antiplatelet agents. ASA                            Grade Assessment: III - A patient with severe                            systemic disease. After reviewing the risks and  benefits, the patient was deemed in satisfactory                            condition to undergo the procedure.                           After obtaining informed consent, the endoscope was                            passed under direct vision. Throughout the                            procedure, the patient's blood pressure, pulse, and                            oxygen   saturations were monitored continuously. The                            [id number]-H190 ([id number]) Olympus endoscope was introduced                            through the mouth, and advanced to the second part                            of duodenum. The [id number]-[id number] ([id number]) Olympus                            duodenoscope was introduced through the mouth, and                            advanced to the area of papilla. The [id number]                            ([id number]) Olympus linear ultrasound scope was                            introduced through the mouth, and advanced to the                            duodenum for ultrasound examination from the                            stomach and duodenum. The upper EUS was                            accomplished without difficulty. The patient                            tolerated the procedure. Scope In: Scope Out: Findings:      ENDOSCOPIC FINDING: :      No gross lesions were noted in the entire esophagus.      The Z-line was irregular and was found 37 cm from the incisors.      One 14 mm submucosal papule (nodule) with no bleeding and  no stigmata of       recent bleeding was found in the gastric antrum.      Patchy mildly erythematous mucosa without bleeding was found in the       gastric body and in the gastric antrum.      No other gross lesions were noted in the entire examined stomach.       Biopsies were taken with a cold forceps for histology and Helicobacter       pylori testing.      No gross lesions were noted in the duodenal bulb, in the first portion       of the duodenum and in the second portion of the duodenum.      The major papilla was hidden under a hood but was otherwise normal.      ENDOSONOGRAPHIC FINDING: :      Pancreatic parenchymal abnormalities were noted in the entire pancreas.       These consisted of lobularity without honeycombing and hyperechoic       strands.      An irregular solid/cystic mass was identified within  the pancreatic       head. The lesion measured 35 mm by 26 mm in maximal cross-sectional       diameter and was more solid in component, while the cystic component       measured 32 mm x 15 mm in maximal cross-sectional diameter. The largest       microcystic portion of the lesion was 10 mm x 6 mm. The outer margins       were irregular. An intact interface was seen between the mass and the       superior mesenteric artery and celiac trunk suggesting a lack of       invasion. As a result of the microcystic nature, it is difficult to       fully evaluate whether the pancreas duct could be involved with the       cystic component. An attempt at diagnostic needle aspiration for fluid       was performed. Color Doppler imaging was utilized prior to needle       puncture to confirm a lack of significant vascular structures within the       needle path. One pass was made with the 22 gauge needle using a       transduodenal approach. A stylet was used. Unfortunately because of this       being a microcystic lesion, aspiration for any fluid was not successful       thus I pursued a needle aspiration for tissue of the more solid       component. Then I transitioned to slow pull technique and using Color       Doppler imaging was utilized prior to needle puncture to confirm a lack       of significant vascular structures within the needle path. Three passes       were made with the 22 gauge Acquire biopsy needle using a transduodenal       approach. A visible core of tissue was obtained. Preliminary cytologic       examination and touch preps were performed. Final cytology results are       pending.      The pancreas duct was measured and while in the head it was normal in       appearance, there was prominence throughout the rest of the duct  as       below and it measured :      - HOP 2.5 mm (head of pancreas)      - NOP 3.1 mm (neck of pancreas)      - BOP 2.4 mm (body of the pancreas)      -  TOP 1.5 -> 0.8 mm (tail of the pancreas).      There was mild prominence/dilation in the common bile duct (4.7 -> 6.8       mm). No evidence of choledocholithiasis.      There was no sign of significant endosonographic abnormality in the       gallbladder.      Endosonographic imaging of the ampulla showed no intramural       (subepithelial) lesion.      A lobulated intramural (subepithelial) lesion was found in the antrum of       the stomach. The lesion was hyperechoic. Sonographically, the lesion       appeared to originate from the submucosa (Layer 3). The lesion measured       14 mm (in maximum thickness). The lesion also measured 9 mm in diameter.       The outer endosonographic borders were smooth. This is consistent with a       lipoma on EUS.      Endosonographic imaging in the visualized portion of the liver showed no       mass.      No malignant-appearing lymph nodes were visualized in the celiac region       (level 20), peripancreatic region and porta hepatis region.      The celiac region was visualized. Impression:               EGD impression:                           - No gross lesions in the entire esophagus. Z-line                            irregular, 37 cm from the incisors.                           - One submucosal papule (nodule) found in the                            antrum of the stomach. On EUS this is a lipoma.                           - Erythematous mucosa in the gastric body and                            antrum. No other gross lesions in the entire                            stomach. Biopsied.                           - No gross lesions in the duodenal bulb, in the  first portion of the duodenum and in the second                            portion of the duodenum.                           - Normal major papilla, hidden under a hood.                           EUS impression:                           - Pancreatic parenchymal  abnormalities consisting                            of lobularity and hyperechoic strands were noted in                            the entire pancreas.                           - A solid/cystic mass was identified in the                            pancreatic head. Cytology results are pending.                            However, the endosonographic appearance is                            suspicious for a serous cystadenoma, based on the                            microcystic nature of the lesion. Attempt at FNA                            did not lead to any fluid being able to be                            aspirated due to the small size of even the largest                            cystic component. Proceeded with 1 needle                            aspiration for tissue and 3 slow pull needle                            biopsies of the lesion. This does not have the                            typical features concerning for underlying  malignancy (adenocarcinoma)                           - Main pancreatic duct (MPD) diameter was measured.                            Endosonographically, the MPD had a normal                            appearance within the head and then is lost within                            the solid/cystic lesion to then reappear within the                            neck region with some slight prominence throughout.                           - There was slight prominence/dilation in the                            common bile duct. No evidence of                            choledocholithiasis.                           - There was no sign of significant pathology in the                            gallbladder.                           - An intramural (subepithelial) lesion was found in                            the antrum of the stomach. The lesion appeared to                            originate from within the submucosa (Layer 3).                             Tissue has not been obtained. However, the                            endosonographic appearance is consistent with a                            lipoma.                           - No malignant-appearing lymph nodes were                            visualized in the celiac region (level 20),  peripancreatic region and porta hepatis region. Moderate Sedation:      Not Applicable - Patient had care per Anesthesia. Recommendation:           - The patient will be observed post-procedure,                            until all discharge criteria are met.                           - Discharge patient to home.                           - Patient has a contact number available for                            emergencies. The signs and symptoms of potential                            delayed complications were discussed with the                            patient. Return to normal activities tomorrow.                            Written discharge instructions were provided to the                            patient.                           - Low fat diet.                           - Observe patient's clinical course.                           - Overall, the patient's clinical picture seems to                            be more of a benign lesion as the patient is not                            having any significant issues or signs concerning                            for underlying progression to malignancy. It does                            not look like the lesion is impeding biliary flow                            as her liver biochemical testing is normal. I think                            with  the mild prominence of the pancreas duct, even                            if the samples returned negative, it is reasonable                            to continue surveillance likely with MRI/MRCP. To                            be determined what the  timing may be. If there are                            any atypical cells, then we will need to consider                            referral to pancreaticobiliary surgery.                           - Monitor for signs/symptoms of pancreatitis,                            perforation, infection, aspiration.                           - Ciprofloxacin  500 mg twice daily x 3 days to                            decrease risk of post interventional infection.                           - Await cytology results and await path results.                           - The findings and recommendations were discussed                            with the patient.                           - The findings and recommendations were discussed                            with the patient's family. Procedure Code(s):        --- Professional ---                           [phone], Esophagogastroduodenoscopy, flexible,                            transoral; with transendoscopic ultrasound-guided                            intramural or transmural fine needle  aspiration/biopsy(s), (includes endoscopic                            ultrasound examination limited to the esophagus,                            stomach or duodenum, and adjacent structures)                           43239, 59, Esophagogastroduodenoscopy, flexible,                            transoral; with biopsy, single or multiple Diagnosis Code(s):        --- Professional ---                           K22.89, Other specified disease of esophagus                           K31.89, Other diseases of stomach and duodenum                           K86.9, Disease of pancreas, unspecified                           K86.89, Other specified diseases of pancreas                           I89.9, Noninfective disorder of lymphatic vessels                            and lymph nodes, unspecified                           K86.2, Cyst of pancreas                            K83.8, Other specified diseases of biliary tract CPT copyright 2022 American Medical Association. All rights reserved. The codes documented in this report are preliminary and upon coder review may  be revised to meet current compliance requirements. Yong Henle, MD 05/30/2023 9:15:07 AM Number of Addenda: 0

## 2023-05-30 NOTE — Anesthesia Postprocedure Evaluation (Signed)
 Anesthesia Post Note  Patient: JORDAIN ABELSON  Procedure(s) Performed: ULTRASOUND, UPPER GI TRACT, ENDOSCOPIC     Patient location during evaluation: Endoscopy Anesthesia Type: MAC Level of consciousness: awake and alert, patient cooperative and oriented Pain management: pain level controlled Vital Signs Assessment: post-procedure vital signs reviewed and stable Respiratory status: spontaneous breathing, nonlabored ventilation and respiratory function stable Cardiovascular status: stable and blood pressure returned to baseline Postop Assessment: no apparent nausea or vomiting and able to ambulate Anesthetic complications: no   No notable events documented.  Last Vitals:  Vitals:   05/30/23 0910 05/30/23 0920  BP: 123/67 (!) 163/81  Pulse: 61 (!) 57  Resp: 16 17  Temp:    SpO2: 97% 97%    Last Pain:  Vitals:   05/30/23 0920  TempSrc:   PainSc: 0-No pain                 Arlinda Barcelona,E. Jeshua Ransford

## 2023-05-30 NOTE — H&P (Signed)
 GASTROENTEROLOGY PROCEDURE H&P NOTE   Primary Care Physician: Artemisa Bile, MD  HPI: Kristin [name] is a 73 y.o. female who presents for EGD/EUS to evaluate an enlarging pancreatic cyst with prior EUS sampling but unknown fluid analysis here for repeat sampling to try to define.  Past Medical History:  Diagnosis Date   Aortic atherosclerosis (HCC) 09/30/2016   Complication of anesthesia    long time to wake up 2018 with same surgery   Coronary atherosclerosis 09/30/2016   By CT findings   Dizziness    Hypothyroidism    Lumbar degenerative disc disease 09/30/2016   Maxillary sinusitis    Osteoporosis    Pancreatic cyst    PONV (postoperative nausea and vomiting)    Renal disorder    Sleep apnea    mild osa, does not use CPAP   Thyroid  disease    Past Surgical History:  Procedure Laterality Date   BACK SURGERY  1997/1999   CARPAL TUNNEL RELEASE Right    CYSTECTOMY  1972   brreast   ESOPHAGOGASTRODUODENOSCOPY (EGD) WITH PROPOFOL  N/A 02/16/2022   Procedure: ESOPHAGOGASTRODUODENOSCOPY (EGD) WITH PROPOFOL ;  Surgeon: Alvis Jourdain, MD;  Location: [facility];  Service: Gastroenterology;  Laterality: N/A;   FINE NEEDLE ASPIRATION N/A 02/16/2022   Procedure: FINE NEEDLE ASPIRATION (FNA) LINEAR;  Surgeon: Alvis Jourdain, MD;  Location: [facility];  Service: Gastroenterology;  Laterality: N/A;   MAXILLARY ANTROSTOMY Left 08/09/2014   Procedure: ENDOSCOPIC MAXILLARY ANTROSTOMY;  Surgeon: Reynold Caves, MD;  Location: [facility];  Service: ENT;  Laterality: Left;   MAXILLARY ANTROSTOMY Left 10/29/2017   Procedure: LEFT MAXILLARY ANTROSTOMY WITH TISSUE REMOVAL;  Surgeon: Reynold Caves, MD;  Location: [facility];  Service: ENT;  Laterality: Left;   NASAL SINUS SURGERY Left 08/09/2014   Procedure: ENDOSCOPIC SINUS SURGERY;  Surgeon: Reynold Caves, MD;  Location: [facility];  Service: ENT;  Laterality: Left;   TUBAL LIGATION  1980   UPPER ESOPHAGEAL ENDOSCOPIC  ULTRASOUND (EUS) N/A 02/16/2022   Procedure: UPPER ESOPHAGEAL ENDOSCOPIC ULTRASOUND (EUS);  Surgeon: Alvis Jourdain, MD;  Location: [facility];  Service: Gastroenterology;  Laterality: N/A;   No current facility-administered medications for this encounter.   No current facility-administered medications for this encounter. Allergies  Allergen Reactions   Bee Venom Anaphylaxis   Hydrocodone -Acetaminophen  Other (See Comments)    Diarrhea, vomiting told to discontinue meds   Penicillins Swelling    Has patient had a PCN reaction causing immediate rash, facial/tongue/throat swelling, SOB or lightheadedness with hypotension: No Has patient had a PCN reaction causing severe rash involving mucus membranes or skin necrosis: No Has patient had a PCN reaction that required hospitalization: No Has patient had a PCN reaction occurring within the last 10 years: No If all of the above answers are "NO", then may proceed with Cephalosporin use.     Sulfa Antibiotics    Family History  Problem Relation Age of Onset   Colon cancer Father    Stroke Paternal Grandfather    Cirrhosis Maternal Grandmother        liver   Hypertension Mother    Social History   Socioeconomic History   Marital status: Married    Spouse name: Not on file   Number of children: 1   Years of education: Not on file   Highest education level: Not on file  Occupational History    Employer: [employer]  Tobacco Use   Smoking status: Former    Current packs/day:  0.00    Types: Cigarettes    Start date: 01/30/1968    Quit date: 01/30/1971    Years since quitting: 52.3    Passive exposure: Past   Smokeless tobacco: Never  Vaping Use   Vaping status: Never Used  Substance and Sexual Activity   Alcohol use: No   Drug use: No   Sexual activity: Yes    Birth control/protection: Surgical    Comment: BTL  Other Topics Concern   Not on file  Social History Narrative   Not on file   Social Drivers of Health    Financial Resource Strain: Not on file  Food Insecurity: Low Risk  (12/06/2022)   Received from [facility]   Hunger Vital Sign    Worried About Running Out of Food in the Last Year: Never true    Ran Out of Food in the Last Year: Never true  Transportation Needs: No Transportation Needs (12/06/2022)   Received from [facility]    In the past 12 months, has lack of reliable transportation kept you from medical appointments, meetings, work or from getting things needed for daily living? : No  Physical Activity: Not on file  Stress: Not on file  Social Connections: Not on file  Intimate Partner Violence: Not on file    Physical Exam: Today's Vitals   05/23/23 1208  Weight: 56.7 kg   Body mass index is 23.62 kg/m. GEN: NAD EYE: Sclerae anicteric ENT: MMM CV: Non-tachycardic GI: Soft, NT/ND NEURO:  Alert & Oriented x 3  Lab Results: No results for input(s): "WBC", "HGB", "HCT", "PLT" in the last 72 hours. BMET No results for input(s): "NA", "K", "CL", "CO2", "GLUCOSE", "BUN", "CREATININE", "CALCIUM " in the last 72 hours. LFT No results for input(s): "PROT", "ALBUMIN", "AST", "ALT", "ALKPHOS", "BILITOT", "BILIDIR", "IBILI" in the last 72 hours. PT/INR No results for input(s): "LABPROT", "INR" in the last 72 hours.   Impression / Plan: This is a 73 y.o.female who presents for EGD/EUS to evaluate an enlarging pancreatic cyst with prior EUS sampling but unknown fluid analysis here for repeat sampling to try to define.  The risks of an EUS including intestinal perforation, bleeding, infection, aspiration, and medication effects were discussed as was the possibility it may not give a definitive diagnosis if a biopsy is performed.  When a biopsy of the pancreas is done as part of the EUS, there is an additional risk of pancreatitis at the rate of about 1-2%.  It was explained that procedure related pancreatitis is typically mild, although it can be severe and  even life threatening, which is why we do not perform random pancreatic biopsies and only biopsy a lesion/area we feel is concerning enough to warrant the risk.  The risks and benefits of endoscopic evaluation/treatment were discussed with the patient and/or family; these include but are not limited to the risk of perforation, infection, bleeding, missed lesions, lack of diagnosis, severe illness requiring hospitalization, as well as anesthesia and sedation related illnesses.  The patient's history has been reviewed, patient examined, no change in status, and deemed stable for procedure.  The patient and/or family is agreeable to proceed.    Yong Henle, MD [facility] Advanced [facility]oscopy Office # [phone]

## 2023-05-30 NOTE — Anesthesia Procedure Notes (Signed)
 Procedure Name: MAC Date/Time: 05/30/2023 8:02 AM  Performed by: Marshall Skeeter, CRNAPre-anesthesia Checklist: Patient identified, Emergency Drugs available, Suction available, Patient being monitored and Timeout performed Oxygen  Delivery Method: Simple face mask Placement Confirmation: positive ETCO2 Dental Injury: Teeth and Oropharynx as per pre-operative assessment

## 2023-05-30 NOTE — Transfer of Care (Signed)
 Immediate Anesthesia Transfer of Care Note  Patient: Kristin [name]  Procedure(s) Performed: ULTRASOUND, UPPER GI TRACT, ENDOSCOPIC  Patient Location: PACU and Endoscopy Unit  Anesthesia Type:MAC  Level of Consciousness: drowsy and patient cooperative  Airway & Oxygen  Therapy: Patient Spontanous Breathing and Patient connected to face mask oxygen   Post-op Assessment: Report given to RN and Post -op Vital signs reviewed and stable  Post vital signs: Reviewed and stable  Last Vitals:  Vitals Value Taken Time  BP    Temp    Pulse    Resp    SpO2      Last Pain:  Vitals:   05/30/23 0706  TempSrc: Temporal  PainSc: 0-No pain         Complications: No notable events documented.

## 2023-05-31 LAB — SURGICAL PATHOLOGY

## 2023-05-31 LAB — CYTOLOGY - NON PAP

## 2023-06-02 ENCOUNTER — Encounter (HOSPITAL_COMMUNITY): Payer: Self-pay | Admitting: Gastroenterology

## 2023-06-02 ENCOUNTER — Encounter: Payer: Self-pay | Admitting: Gastroenterology

## 2023-06-05 ENCOUNTER — Other Ambulatory Visit: Payer: Self-pay

## 2023-06-07 NOTE — Progress Notes (Signed)
 The proposed treatment discussed in conference is for discussion purpose only and is not a binding recommendation.  The patients have not been physically examined, or presented with their treatment options.  Therefore, final treatment plans cannot be decided.

## 2023-06-20 DIAGNOSIS — M7541 Impingement syndrome of right shoulder: Secondary | ICD-10-CM | POA: Diagnosis not present

## 2023-07-28 DIAGNOSIS — M7541 Impingement syndrome of right shoulder: Secondary | ICD-10-CM | POA: Diagnosis not present

## 2023-08-08 DIAGNOSIS — M75121 Complete rotator cuff tear or rupture of right shoulder, not specified as traumatic: Secondary | ICD-10-CM | POA: Diagnosis not present

## 2023-08-08 DIAGNOSIS — M7541 Impingement syndrome of right shoulder: Secondary | ICD-10-CM | POA: Diagnosis not present

## 2023-08-08 DIAGNOSIS — M19011 Primary osteoarthritis, right shoulder: Secondary | ICD-10-CM | POA: Diagnosis not present

## 2023-08-19 DIAGNOSIS — Z79899 Other long term (current) drug therapy: Secondary | ICD-10-CM | POA: Diagnosis not present

## 2023-08-19 DIAGNOSIS — R7301 Impaired fasting glucose: Secondary | ICD-10-CM | POA: Diagnosis not present

## 2023-08-19 DIAGNOSIS — E039 Hypothyroidism, unspecified: Secondary | ICD-10-CM | POA: Diagnosis not present

## 2023-08-23 DIAGNOSIS — T63441A Toxic effect of venom of bees, accidental (unintentional), initial encounter: Secondary | ICD-10-CM | POA: Diagnosis not present

## 2023-08-23 DIAGNOSIS — E1169 Type 2 diabetes mellitus with other specified complication: Secondary | ICD-10-CM | POA: Diagnosis not present

## 2023-08-29 DIAGNOSIS — M25511 Pain in right shoulder: Secondary | ICD-10-CM | POA: Diagnosis not present

## 2023-09-01 ENCOUNTER — Encounter: Payer: Self-pay | Admitting: Gastroenterology

## 2023-09-01 NOTE — Progress Notes (Signed)
 Review of prior MDC discussion was that we were going to plan a repeat imaging study at 6 months of the pancreatic cyst. I had not seen that my documentation note had gone in there after her MDC. Will forward this to my team into the patient's primary gastroenterologist. I will have my team reach out to the patient and let them know that this was the final discussion and apologize for the lateness of the [facility] reply.  I will be available to follow-up the imaging once it has been completed if necessary based on the primary GI team.   Aloha Finner, MD [facility] Advanced Endoscopy Office # [phone]

## 2023-09-01 NOTE — Progress Notes (Signed)
 Thanks Aloha Caldron, please put a reminder for repeat MRI/ MRCP at the end of October. Dx: pancreatic lesion Thanks

## 2023-09-02 NOTE — Progress Notes (Signed)
 Left message on machine to call back

## 2023-09-02 NOTE — Progress Notes (Signed)
 The pt returned call and has been given the recommendations per Dr Wilhelmenia.  She agrees and will follow up with MRI MRCP in 6 months with her primary GI

## 2023-09-02 NOTE — Progress Notes (Signed)
 Patient is already on recall for 6 mth MRCP/MRI for November but I changed it to October

## 2023-09-02 NOTE — Progress Notes (Signed)
 Thanks

## 2023-09-05 ENCOUNTER — Telehealth (INDEPENDENT_AMBULATORY_CARE_PROVIDER_SITE_OTHER): Payer: Self-pay | Admitting: Gastroenterology

## 2023-09-05 NOTE — Telephone Encounter (Signed)
 Pt left voicemail stating that she was referred to Dr.Mansouraty and had a procedure on 05/30/23. Pt states she received a call yesterday that she needs to schedule a MRI 6 months from procedure and was wanting to know if Dr.Castaneda or Mitzie could order the MRI and fax results to Dr.Mansouraty.  Returned call to patient just to verify that she needs MRI 6 months after procedure. Pt verbalized that was correct.  Pt had EUS on 05/30/23. Please advise. Thank you!!

## 2023-09-05 NOTE — Telephone Encounter (Signed)
Pt contacted and verbalized understanding. Pt very thankful

## 2023-09-06 ENCOUNTER — Encounter (INDEPENDENT_AMBULATORY_CARE_PROVIDER_SITE_OTHER): Payer: Self-pay | Admitting: Emergency Medicine

## 2023-09-12 DIAGNOSIS — M25511 Pain in right shoulder: Secondary | ICD-10-CM | POA: Diagnosis not present

## 2023-09-13 DIAGNOSIS — M19011 Primary osteoarthritis, right shoulder: Secondary | ICD-10-CM | POA: Diagnosis not present

## 2023-09-13 DIAGNOSIS — M7541 Impingement syndrome of right shoulder: Secondary | ICD-10-CM | POA: Diagnosis not present

## 2023-09-13 DIAGNOSIS — M75121 Complete rotator cuff tear or rupture of right shoulder, not specified as traumatic: Secondary | ICD-10-CM | POA: Diagnosis not present

## 2023-09-13 DIAGNOSIS — M7542 Impingement syndrome of left shoulder: Secondary | ICD-10-CM | POA: Diagnosis not present

## 2023-09-17 DIAGNOSIS — M25511 Pain in right shoulder: Secondary | ICD-10-CM | POA: Diagnosis not present

## 2023-09-24 DIAGNOSIS — M25511 Pain in right shoulder: Secondary | ICD-10-CM | POA: Diagnosis not present

## 2023-10-02 DIAGNOSIS — M25511 Pain in right shoulder: Secondary | ICD-10-CM | POA: Diagnosis not present

## 2023-10-09 DIAGNOSIS — M25511 Pain in right shoulder: Secondary | ICD-10-CM | POA: Diagnosis not present

## 2023-10-10 ENCOUNTER — Encounter (INDEPENDENT_AMBULATORY_CARE_PROVIDER_SITE_OTHER): Payer: Self-pay | Admitting: *Deleted

## 2023-10-15 ENCOUNTER — Telehealth (INDEPENDENT_AMBULATORY_CARE_PROVIDER_SITE_OTHER): Payer: Self-pay

## 2023-10-15 ENCOUNTER — Encounter (INDEPENDENT_AMBULATORY_CARE_PROVIDER_SITE_OTHER): Payer: Self-pay | Admitting: Gastroenterology

## 2023-10-15 ENCOUNTER — Ambulatory Visit (INDEPENDENT_AMBULATORY_CARE_PROVIDER_SITE_OTHER): Admitting: Gastroenterology

## 2023-10-15 ENCOUNTER — Other Ambulatory Visit (INDEPENDENT_AMBULATORY_CARE_PROVIDER_SITE_OTHER): Payer: Self-pay

## 2023-10-15 VITALS — BP 116/79 | HR 70 | Temp 97.8°F | Ht 61.0 in | Wt 119.8 lb

## 2023-10-15 DIAGNOSIS — D279 Benign neoplasm of unspecified ovary: Secondary | ICD-10-CM

## 2023-10-15 DIAGNOSIS — K862 Cyst of pancreas: Secondary | ICD-10-CM

## 2023-10-15 NOTE — Progress Notes (Addendum)
 Referring Provider: Sheryle Carwin, MD Primary Care Physician:  Sheryle Carwin, MD Primary GI Physician: Dr. Eartha   Chief Complaint  Patient presents with   Follow-up    Patient here today for a follow up on the cyst on her pancrease. Patient had a recent upper EUS done by Dr. Wilhelmenia on 05/30/2023. Per the patient she would like for us  to submit today's visit to Dr. Wilhelmenia. She is in need for us  to schedule an MRI in November and for us  to submit this result to Dr. Wilhelmenia. Patient denies any current gi related issues at today's visit in clinic.   HPI:   EZELLA KELL is a 73 y.o. female with past medical history of atherosclerosis, hypothyroidism, osteoporosis, OSA , pancreatic lesion   Patient presenting today for follow up of: Pancreatic lesion (serous cystadenoma)  Last seen February, at that time, having some occasional abdominal discomfort maybe 1-2 times over the past few months. Requesting to have EUS In [place] due to transportation  EUS in may as outlined below, in summary pancreatic mass which appeared to be serous cystadenoma, pathology with no malignant cells identified.  Case presented to [facility], recommend repeat MRCP 6 months from EUS as well as LFTs  Present:  States she is feeling well. Appetite is good. She has lost a few pounds but notes she has been outside doing a lot of work in the garden this summer She feels good overall, has no GI complaints today  No red flag symptoms. Patient denies melena, hematochezia, nausea, vomiting, diarrhea, constipation, dysphagia, odyonophagia, early satiety or weight loss.    Pertinent history:  EUS May 2025: Dr. Rondell - No gross lesions in the entire esophagus. Z-line                            irregular, 37 cm from the incisors.                           - One submucosal papule (nodule) found in the                            antrum of the stomach. On EUS this is a lipoma.                           - Erythematous  mucosa in the gastric body and                            antrum. No other gross lesions in the entire                            stomach. Biopsied.                           - No gross lesions in the duodenal bulb, in the                            first portion of the duodenum and in the second                            portion of the duodenum.                           -  Normal major papilla, hidden under a hood.                           EUS impression:                           - Pancreatic parenchymal abnormalities consisting                            of lobularity and hyperechoic strands were noted in                            the entire pancreas.                           - A solid/cystic mass was identified in the                            pancreatic head. Cytology results are pending.                            However, the endosonographic appearance is                            suspicious for a serous cystadenoma, based on the                            microcystic nature of the lesion. Attempt at FNA                            did not lead to any fluid being able to be                            aspirated due to the small size of even the largest                            cystic component. Proceeded with 1 needle                            aspiration for tissue and 3 slow pull needle                            biopsies of the lesion. This does not have the                            typical features concerning for underlying                            malignancy (adenocarcinoma)                           - Main pancreatic duct (MPD) diameter was measured.  Endosonographically, the MPD had a normal                            appearance within the head and then is lost within                            the solid/cystic lesion to then reappear within the                            neck region with some slight prominence throughout.                            - There was slight prominence/dilation in the                            common bile duct. No evidence of                            choledocholithiasis.                           - There was no sign of significant pathology in the                            gallbladder.                           - An intramural (subepithelial) lesion was found in                            the antrum of the stomach. The lesion appeared to                            originate from within the submucosa (Layer 3).                            Tissue has not been obtained. However, the                            endosonographic appearance is consistent with a                            lipoma.                           - No malignant-appearing lymph nodes were                            visualized in the celiac region (level 20),                            peripancreatic region and porta hepatis region.  MRCP ordered by Dr. Kristie, 08/22/2022.  This showed a 3.3 cm complex cystic mass in the pancreatic head still consistent with a serous cystadenoma.  There was stable mild pancreatic  ductal dilation.  -EUS on 02/16/2022 by Dr. Belvie Just at [facility]. Report showed: - Anechoic lesions suggestive of multiple cysts were identified in the pancreatic head. Diagnostic needle aspiration for fluid was performed. Color Doppler imaging was utilized prior to needle puncture to confirm a lack of significant vascular structures within the needle path. One pass was made with the 22 gauge needle using a transduodenal approach. A stylet was used. The amount of fluid collected was 2 mL. The fluid was white, blood- tinged, thin and watery. Sample( s) were sent for amylase concentration, lipase, cytology and CEA.   In the head of the pancreas a multicystic lesion was identified with the possibility of a solid component. At its largest dimension the lesion measured 3. 9 cm x 2. 8 cm. Several larger cysts were near the  tip of the echoendoscope and fluid was able to aspirated. The fluid was clear in the aspiration syringe and it was thin/ watery. From the FNA needle there was evidence of blood tinged fluid. The lesion exhibited many coursing blood vessels. A safe window for further aspiration and FNA of the possible solid component was not possible during this examination. If a repeat EUS with FNA is necessary a small window does exist at Station 4. At Station 3 there were a couple of small windows where the vasculature was minimal. Ciprofloxoacin was provided for her during the procedure. The CBD was normal in appearance and caliber. A confident view of the PD in the pancreatic head was not obtained, but there was no upstream PD dilation.  Unfortunately, no results of fluid analysis are available.  MRCP done 11/2021 with enlargement of pancreatic cystic mass measuring 3.9x 3.1 x3.5 cm, concerning for growing cystoadenoma.    - MRI in November 2022 with Stable multilobulated cystic mass in the head of the pancreas measuring 2.7 cm.   -MRI of the abdomen and without IV contrast on 07/15/2019 which showed a stable 2.8 x 2.6 cm multicystic lesion in the head of the pancreas without main duct dilation which was very similar to a PET scan from 2013 measuring 2.2 x 2.1 cm.    -June 2020: case was discussed with Dr. Teressa who considered that given that the lesion has been stable and did not have criteria for invasive testing, he recommended clinical surveillance or repeating abdominal imaging in 1 year.   Last Colonoscopy: had colonoscopy with Dr. Kristie in November 2023, normal per patient.  Recommended to repeat every 3 years - brother and father had colon cancer  Filed Weights   10/15/23 1012  Weight: 119 lb 12.8 oz (54.3 kg)     Past Medical History:  Diagnosis Date   Aortic atherosclerosis (HCC) 09/30/2016   Complication of anesthesia    long time to wake up 2018 with same surgery   Coronary atherosclerosis  09/30/2016   By CT findings   Dizziness    Hypothyroidism    Lumbar degenerative disc disease 09/30/2016   Maxillary sinusitis    Osteoporosis    Pancreatic cyst    PONV (postoperative nausea and vomiting)    Renal disorder    Sleep apnea    mild osa, does not use CPAP   Thyroid  disease     Past Surgical History:  Procedure Laterality Date   BACK SURGERY  1997/1999   CARPAL TUNNEL RELEASE Right    CYSTECTOMY  1972   brreast   ESOPHAGOGASTRODUODENOSCOPY N/A 05/30/2023   Procedure: EGD (ESOPHAGOGASTRODUODENOSCOPY);  Surgeon: Wilhelmenia Roers  Mickey., MD;  Location: [facility];  Service: Gastroenterology;  Laterality: N/A;   ESOPHAGOGASTRODUODENOSCOPY (EGD) WITH PROPOFOL  N/A 02/16/2022   Procedure: ESOPHAGOGASTRODUODENOSCOPY (EGD) WITH PROPOFOL ;  Surgeon: Rollin Dover, MD;  Location: [facility];  Service: Gastroenterology;  Laterality: N/A;   EUS N/A 05/30/2023   Procedure: ULTRASOUND, UPPER GI TRACT, ENDOSCOPIC;  Surgeon: Wilhelmenia Aloha Mickey., MD;  Location: [facility];  Service: Gastroenterology;  Laterality: N/A;   FINE NEEDLE ASPIRATION N/A 02/16/2022   Procedure: FINE NEEDLE ASPIRATION (FNA) LINEAR;  Surgeon: Rollin Dover, MD;  Location: [facility];  Service: Gastroenterology;  Laterality: N/A;   FINE NEEDLE ASPIRATION  05/30/2023   Procedure: FINE NEEDLE ASPIRATION;  Surgeon: Wilhelmenia Aloha Mickey., MD;  Location: [facility];  Service: Gastroenterology;;   MAXILLARY ANTROSTOMY Left 08/09/2014   Procedure: ENDOSCOPIC MAXILLARY ANTROSTOMY;  Surgeon: Daniel Moccasin, MD;  Location: [facility];  Service: ENT;  Laterality: Left;   MAXILLARY ANTROSTOMY Left 10/29/2017   Procedure: LEFT MAXILLARY ANTROSTOMY WITH TISSUE REMOVAL;  Surgeon: Moccasin Daniel, MD;  Location: [facility];  Service: ENT;  Laterality: Left;   NASAL SINUS SURGERY Left 08/09/2014   Procedure: ENDOSCOPIC SINUS SURGERY;  Surgeon: Daniel Moccasin, MD;  Location:  SURGERY CENTER;  Service: ENT;   Laterality: Left;   TUBAL LIGATION  1980   UPPER ESOPHAGEAL ENDOSCOPIC ULTRASOUND (EUS) N/A 02/16/2022   Procedure: UPPER ESOPHAGEAL ENDOSCOPIC ULTRASOUND (EUS);  Surgeon: Rollin Dover, MD;  Location: [facility];  Service: Gastroenterology;  Laterality: N/A;    Current Outpatient Medications  Medication Sig Dispense Refill   acetaminophen  (TYLENOL ) 500 MG tablet Take 500 mg by mouth every 6 (six) hours as needed.     alendronate (FOSAMAX) 70 MG tablet Take 70 mg by mouth every Friday. Take with a full glass of water on an empty stomach.     calcium  carbonate (OSCAL) 1500 (600 Ca) MG TABS tablet Take 1,500 mg by mouth daily with breakfast.     Cholecalciferol (VITAMIN D ) 125 MCG (5000 UT) CAPS Take 5,000 Units by mouth daily with lunch.     Coenzyme Q10 200 MG capsule Take 200 mg by mouth every evening.     levothyroxine  (SYNTHROID ) 88 MCG tablet Take 88 mcg by mouth daily before breakfast.     meclizine  (ANTIVERT ) 25 MG tablet Take 25 mg by mouth 3 (three) times daily as needed for dizziness.     Omega-3 Fatty Acids (FISH OIL ULTRA) 1400 MG CAPS Take 1,400 mg by mouth daily with lunch.     rosuvastatin  (CRESTOR ) 40 MG tablet Take 40 mg by mouth every evening.     No current facility-administered medications for this visit.    Allergies as of 10/15/2023 - Review Complete 10/15/2023  Allergen Reaction Noted   Bee venom Anaphylaxis 09/30/2016   Hydrocodone -acetaminophen  Other (See Comments) 02/25/2019   Penicillins Swelling 12/06/2011   Sulfa antibiotics  04/01/2021    Social History   Socioeconomic History   Marital status: Married    Spouse name: Not on file   Number of children: 1   Years of education: Not on file   Highest education level: Not on file  Occupational History    Employer: [employer]  Tobacco Use   Smoking status: Former    Current packs/day: 0.00    Types: Cigarettes    Start date: 01/30/1968    Quit date: 01/30/1971    Years since quitting: 52.7     Passive exposure: Past   Smokeless tobacco: Never  Vaping Use   Vaping status: Never Used  Substance and Sexual Activity   Alcohol use: No   Drug use: No   Sexual activity: Yes    Birth control/protection: Surgical    Comment: BTL  Other Topics Concern   Not on file  Social History Narrative   Not on file   Social Drivers of Health   Financial Resource Strain: Not on file  Food Insecurity: Low Risk  (12/06/2022)   Received from [facility]   Hunger Vital Sign    Within the past 12 months, you worried that your food would run out before you got money to buy more: Never true    Within the past 12 months, the food you bought just didn't last and you didn't have money to get more. : Never true  Transportation Needs: No Transportation Needs (12/06/2022)   Received from [facility]    In the past 12 months, has lack of reliable transportation kept you from medical appointments, meetings, work or from getting things needed for daily living? : No  Physical Activity: Not on file  Stress: Not on file  Social Connections: Not on file    Review of systems General: negative for malaise, night sweats, fever, chills, weight loss Neck: Negative for lumps, goiter, pain and significant neck swelling Resp: Negative for cough, wheezing, dyspnea at rest CV: Negative for chest pain, leg swelling, palpitations, orthopnea GI: denies melena, hematochezia, nausea, vomiting, diarrhea, constipation, dysphagia, odyonophagia, early satiety or unintentional weight loss.  MSK: Negative for joint pain or swelling, back pain, and muscle pain. Derm: Negative for itching or rash Psych: Denies depression, anxiety, memory loss, confusion. No homicidal or suicidal ideation.  Heme: Negative for prolonged bleeding, bruising easily, and swollen nodes. Endocrine: Negative for cold or heat intolerance, polyuria, polydipsia and goiter. Neuro: negative for tremor, gait imbalance, syncope and  seizures. The remainder of the review of systems is noncontributory.  Physical Exam: BP 116/79 (BP Location: Left Arm, Patient Position: Sitting, Cuff Size: Normal)   Pulse 70   Temp 97.8 F (36.6 C) (Temporal)   Ht 5' 1 (1.549 m)   Wt 119 lb 12.8 oz (54.3 kg)   BMI 22.64 kg/m  General:   Alert and oriented. No distress noted. Pleasant and cooperative.  Head:  Normocephalic and atraumatic. Eyes:  Conjuctiva clear without scleral icterus. Mouth:  Oral mucosa pink and moist. Good dentition. No lesions. Heart: Normal rate and rhythm, s1 and s2 heart sounds present.  Lungs: Clear lung sounds in all lobes. Respirations equal and unlabored. Abdomen:  +BS, soft, non-tender and non-distended. No rebound or guarding. No HSM or masses noted. Derm: No palmar erythema or jaundice Msk:  Symmetrical without gross deformities. Normal posture. Extremities:  Without edema. Neurologic:  Alert and  oriented x4 Psych:  Alert and cooperative. Normal mood and affect.  Invalid input(s): 6 MONTHS   ASSESSMENT: KITT MINARDI is a 73 y.o. female presenting today for follow up of pancreatic lesion  Multi year history of presence of pancreatic lesion which had grown in size with max size of 3.9cm in 2023 for which she underwent EUS, EUS showed changes possibly suggestive of serous cystadenoma, however, unfortunately sampling of the possible solid component was difficult given vascularity of the lesion. After her last visit here, she Was recommended to have repeat EUS which she underwent with Dr. Wilhelmenia in May as outlined above, with findings remaining consistent with SCA, path with no malignant cells present.  Was recommend to have surveillance MRI and CMP again 6 months from procedure. She will be due for this in late October/early November. Will communicate these findings with Dr. Wilhelmenia once imaging results are back to discuss further recommendations. She has no GI complaints today.     PLAN:   -repeat MRCP end of October/early November (on recall list) -repeat LFTs at time of MRCP as well  -will be in touch with Dr. Wilhelmenia pending imaging and labs -repeat Colonoscopy November 2026  All questions were answered, patient verbalized understanding and is in agreement with plan as outlined above.    Follow Up: 6 months   Issa Kosmicki L. Mariette, MSN, APRN, AGNP-C Adult-Gerontology Nurse Practitioner [facility]  I have reviewed the note and agree with the APP's assessment as described in this progress note  Toribio Fortune, MD Gastroenterology and Hepatology [facility]

## 2023-10-15 NOTE — Telephone Encounter (Signed)
 Spoke with patient, patient is aware of MRCP appointment on 12/02/2023 at 9:30am.

## 2023-10-15 NOTE — Patient Instructions (Signed)
 We will call you closer to due time to schedule repeat MRI at the end of October/early November and repeat liver enzymes then as well  We will be in touch with Dr. Wilhelmenia regarding imaging once results are back   Due for colonoscopy next November  Follow up 6 months  It was a pleasure to see you today. I want to create trusting relationships with patients and provide genuine, compassionate, and quality care. I truly value your feedback! please be on the lookout for a survey regarding your visit with me today. I appreciate your input about our visit and your time in completing this!    Kristin Sheek L. Toye Rouillard, MSN, APRN, AGNP-C Adult-Gerontology Nurse Practitioner [facility] at [facility]

## 2023-10-16 DIAGNOSIS — M25511 Pain in right shoulder: Secondary | ICD-10-CM | POA: Diagnosis not present

## 2023-11-08 ENCOUNTER — Encounter (INDEPENDENT_AMBULATORY_CARE_PROVIDER_SITE_OTHER): Payer: Self-pay

## 2023-11-08 DIAGNOSIS — Z23 Encounter for immunization: Secondary | ICD-10-CM | POA: Diagnosis not present

## 2023-11-11 DIAGNOSIS — E063 Autoimmune thyroiditis: Secondary | ICD-10-CM | POA: Diagnosis not present

## 2023-11-13 ENCOUNTER — Encounter (INDEPENDENT_AMBULATORY_CARE_PROVIDER_SITE_OTHER): Payer: Self-pay | Admitting: Gastroenterology

## 2023-11-14 DIAGNOSIS — E063 Autoimmune thyroiditis: Secondary | ICD-10-CM | POA: Diagnosis not present

## 2023-11-28 DIAGNOSIS — D279 Benign neoplasm of unspecified ovary: Secondary | ICD-10-CM | POA: Diagnosis not present

## 2023-11-29 LAB — COMPREHENSIVE METABOLIC PANEL WITH GFR
AG Ratio: 2.3 (calc) (ref 1.0–2.5)
ALT: 14 U/L (ref 6–29)
AST: 23 U/L (ref 10–35)
Albumin: 4.8 g/dL (ref 3.6–5.1)
Alkaline phosphatase (APISO): 49 U/L (ref 37–153)
BUN: 21 mg/dL (ref 7–25)
CO2: 31 mmol/L (ref 20–32)
Calcium: 10 mg/dL (ref 8.6–10.4)
Chloride: 105 mmol/L (ref 98–110)
Creat: 0.82 mg/dL (ref 0.60–1.00)
Globulin: 2.1 g/dL (ref 1.9–3.7)
Glucose, Bld: 104 mg/dL — ABNORMAL HIGH (ref 65–99)
Potassium: 5.1 mmol/L (ref 3.5–5.3)
Sodium: 143 mmol/L (ref 135–146)
Total Bilirubin: 0.5 mg/dL (ref 0.2–1.2)
Total Protein: 6.9 g/dL (ref 6.1–8.1)
eGFR: 75 mL/min/1.73m2 (ref >=60)

## 2023-12-02 ENCOUNTER — Ambulatory Visit (INDEPENDENT_AMBULATORY_CARE_PROVIDER_SITE_OTHER): Payer: Self-pay | Admitting: Gastroenterology

## 2023-12-02 ENCOUNTER — Other Ambulatory Visit (INDEPENDENT_AMBULATORY_CARE_PROVIDER_SITE_OTHER): Payer: Self-pay | Admitting: Gastroenterology

## 2023-12-02 ENCOUNTER — Ambulatory Visit (HOSPITAL_COMMUNITY)
Admission: RE | Admit: 2023-12-02 | Discharge: 2023-12-02 | Disposition: A | Discharge status: Home / Self Care | Source: Ambulatory Visit | Attending: Gastroenterology | Admitting: Gastroenterology

## 2023-12-02 DIAGNOSIS — D279 Benign neoplasm of unspecified ovary: Secondary | ICD-10-CM | POA: Insufficient documentation

## 2023-12-02 DIAGNOSIS — N281 Cyst of kidney, acquired: Secondary | ICD-10-CM | POA: Diagnosis not present

## 2023-12-02 MED ORDER — GADOBUTROL 1 MMOL/ML IV SOLN
7.0000 mL | Freq: Once | INTRAVENOUS | Status: AC | PRN
  Administered 2023-12-02: 7 mL via INTRAVENOUS

## 2023-12-09 ENCOUNTER — Other Ambulatory Visit (INDEPENDENT_AMBULATORY_CARE_PROVIDER_SITE_OTHER): Payer: Self-pay | Admitting: Gastroenterology

## 2023-12-09 DIAGNOSIS — D279 Benign neoplasm of unspecified ovary: Secondary | ICD-10-CM

## 2023-12-09 DIAGNOSIS — R978 Other abnormal tumor markers: Secondary | ICD-10-CM

## 2023-12-10 ENCOUNTER — Other Ambulatory Visit: Payer: Self-pay

## 2023-12-10 ENCOUNTER — Ambulatory Visit: Payer: Self-pay

## 2023-12-10 DIAGNOSIS — R978 Other abnormal tumor markers: Secondary | ICD-10-CM

## 2023-12-10 DIAGNOSIS — K862 Cyst of pancreas: Secondary | ICD-10-CM

## 2023-12-10 LAB — CANCER ANTIGEN 19-9: CA 19-9: 50 U/mL — ABNORMAL HIGH (ref 0–35)

## 2023-12-10 NOTE — Telephone Encounter (Signed)
 Let us  go ahead and plan for repeat upper EUS. Thanks. GM   Aylla Huffine, Please work on scheduling repeat upper EUS pancreatic cyst elevated CA 19-9. Thanks. GM

## 2023-12-10 NOTE — Telephone Encounter (Signed)
-----   Message from [facility] sent at 12/10/2023 12:09 PM EST ----- Let us  go ahead and plan for repeat upper EUS. Thanks. GM  Steffanie Mingle, Please work on scheduling repeat upper EUS pancreatic cyst elevated CA 19-9. Thanks. GM ----- Message ----- From: Mariette Mitzie CROME, NP Sent: 12/10/2023   8:58 AM EST To: Odetta CROME Curly, RN; Aloha Wilhelmenia Raddle., #  FYI on most recent CA 19-9.  ----- Message ----- From: Rebecka Memos Lab Results In Sent: 12/10/2023   5:35 AM EST To: Mitzie CROME Mariette, NP

## 2023-12-17 DIAGNOSIS — Z79899 Other long term (current) drug therapy: Secondary | ICD-10-CM | POA: Diagnosis not present

## 2023-12-24 DIAGNOSIS — M79672 Pain in left foot: Secondary | ICD-10-CM | POA: Diagnosis not present

## 2023-12-24 DIAGNOSIS — E1169 Type 2 diabetes mellitus with other specified complication: Secondary | ICD-10-CM | POA: Diagnosis not present

## 2024-01-06 DIAGNOSIS — H04123 Dry eye syndrome of bilateral lacrimal glands: Secondary | ICD-10-CM | POA: Diagnosis not present

## 2024-01-06 DIAGNOSIS — H524 Presbyopia: Secondary | ICD-10-CM | POA: Diagnosis not present

## 2024-01-06 DIAGNOSIS — E119 Type 2 diabetes mellitus without complications: Secondary | ICD-10-CM | POA: Diagnosis not present

## 2024-01-06 DIAGNOSIS — H2513 Age-related nuclear cataract, bilateral: Secondary | ICD-10-CM | POA: Diagnosis not present

## 2024-01-16 ENCOUNTER — Encounter (HOSPITAL_COMMUNITY): Payer: Self-pay | Admitting: Gastroenterology

## 2024-01-16 NOTE — Progress Notes (Signed)
 Attempted to obtain medical history via telephone, unable to reach at this time. Unable to leave voicemail to return pre surgical testing department's phone call,due to mailbox not available.

## 2024-01-20 ENCOUNTER — Telehealth: Payer: Self-pay | Admitting: Gastroenterology

## 2024-01-20 NOTE — Telephone Encounter (Addendum)
 Procedure:Upper EUS Procedure date: 01/27/24 Procedure location: [facility] Arrival Time: 10:00 am Spoke with the patient Y/N: Yes Any prep concerns? No   Has the patient obtained the prep from the pharmacy ? No prep needed Do you have a care partner and transportation: Yes Any additional concerns? No

## 2024-01-27 ENCOUNTER — Ambulatory Visit (HOSPITAL_COMMUNITY): Admitting: Certified Registered"

## 2024-01-27 ENCOUNTER — Encounter (HOSPITAL_COMMUNITY): Payer: Self-pay | Admitting: Gastroenterology

## 2024-01-27 ENCOUNTER — Other Ambulatory Visit: Payer: Self-pay

## 2024-01-27 ENCOUNTER — Ambulatory Visit (HOSPITAL_COMMUNITY)
Admission: RE | Admit: 2024-01-27 | Discharge: 2024-01-27 | Disposition: A | Discharge status: Home / Self Care | Attending: Gastroenterology | Admitting: Gastroenterology

## 2024-01-27 ENCOUNTER — Encounter (HOSPITAL_COMMUNITY)
Admission: RE | Disposition: A | Discharge status: Home / Self Care | Payer: Self-pay | Source: Home / Self Care | Attending: Gastroenterology

## 2024-01-27 DIAGNOSIS — K869 Disease of pancreas, unspecified: Secondary | ICD-10-CM

## 2024-01-27 DIAGNOSIS — K3189 Other diseases of stomach and duodenum: Secondary | ICD-10-CM | POA: Diagnosis not present

## 2024-01-27 DIAGNOSIS — I899 Noninfective disorder of lymphatic vessels and lymph nodes, unspecified: Secondary | ICD-10-CM | POA: Diagnosis not present

## 2024-01-27 DIAGNOSIS — E039 Hypothyroidism, unspecified: Secondary | ICD-10-CM | POA: Diagnosis not present

## 2024-01-27 DIAGNOSIS — G4733 Obstructive sleep apnea (adult) (pediatric): Secondary | ICD-10-CM | POA: Insufficient documentation

## 2024-01-27 DIAGNOSIS — K2289 Other specified disease of esophagus: Secondary | ICD-10-CM

## 2024-01-27 DIAGNOSIS — R978 Other abnormal tumor markers: Secondary | ICD-10-CM

## 2024-01-27 DIAGNOSIS — Z87891 Personal history of nicotine dependence: Secondary | ICD-10-CM | POA: Diagnosis not present

## 2024-01-27 DIAGNOSIS — K449 Diaphragmatic hernia without obstruction or gangrene: Secondary | ICD-10-CM

## 2024-01-27 DIAGNOSIS — K862 Cyst of pancreas: Secondary | ICD-10-CM

## 2024-01-27 DIAGNOSIS — I2510 Atherosclerotic heart disease of native coronary artery without angina pectoris: Secondary | ICD-10-CM | POA: Diagnosis not present

## 2024-01-27 DIAGNOSIS — K8689 Other specified diseases of pancreas: Secondary | ICD-10-CM

## 2024-01-27 DIAGNOSIS — K259 Gastric ulcer, unspecified as acute or chronic, without hemorrhage or perforation: Secondary | ICD-10-CM | POA: Diagnosis not present

## 2024-01-27 HISTORY — PX: EUS: SHX5427

## 2024-01-27 HISTORY — PX: ESOPHAGOGASTRODUODENOSCOPY: SHX5428

## 2024-01-27 HISTORY — PX: BIOPSY OF SKIN SUBCUTANEOUS TISSUE AND/OR MUCOUS MEMBRANE: SHX6741

## 2024-01-27 HISTORY — PX: FINE NEEDLE ASPIRATION: SHX6590

## 2024-01-27 SURGERY — EGD (ESOPHAGOGASTRODUODENOSCOPY)
Anesthesia: Monitor Anesthesia Care

## 2024-01-27 MED ORDER — PROPOFOL 10 MG/ML IV BOLUS
INTRAVENOUS | Status: DC | PRN
  Administered 2024-01-27: 135 ug/kg/min via INTRAVENOUS
  Administered 2024-01-27: 20 mg via INTRAVENOUS
  Administered 2024-01-27: 30 mg via INTRAVENOUS

## 2024-01-27 MED ORDER — ONDANSETRON HCL 4 MG/2ML IJ SOLN
INTRAMUSCULAR | Status: DC | PRN
  Administered 2024-01-27: 4 mg via INTRAVENOUS

## 2024-01-27 MED ORDER — PROPOFOL 500 MG/50ML IV EMUL
INTRAVENOUS | Status: AC
  Filled 2024-01-27: qty 100

## 2024-01-27 MED ORDER — LIDOCAINE 2% (20 MG/ML) 5 ML SYRINGE
INTRAMUSCULAR | Status: DC | PRN
  Administered 2024-01-27: 60 mg via INTRAVENOUS

## 2024-01-27 MED ORDER — CIPROFLOXACIN HCL 500 MG PO TABS: 500.0000 mg | ORAL_TABLET | Freq: Two times a day (BID) | ORAL | 0 refills | Status: AC

## 2024-01-27 MED ORDER — CIPROFLOXACIN IN D5W 400 MG/200ML IV SOLN
INTRAVENOUS | Status: AC
  Filled 2024-01-27: qty 200

## 2024-01-27 MED ORDER — CIPROFLOXACIN IN D5W 400 MG/200ML IV SOLN
INTRAVENOUS | Status: DC | PRN
  Administered 2024-01-27: 400 mg via INTRAVENOUS

## 2024-01-27 MED ORDER — PANTOPRAZOLE SODIUM 40 MG PO TBEC: 40.0000 mg | DELAYED_RELEASE_TABLET | Freq: Every day | ORAL | 6 refills | Status: AC

## 2024-01-27 MED ORDER — SODIUM CHLORIDE 0.9 % IV SOLN: INTRAVENOUS | Status: DC

## 2024-01-27 MED ORDER — SODIUM CHLORIDE 0.9 % IV SOLN
INTRAVENOUS | Status: AC | PRN
  Administered 2024-01-27: 500 mL via INTRAMUSCULAR

## 2024-01-27 NOTE — Transfer of Care (Addendum)
 Immediate Anesthesia Transfer of Care Note  Patient: Kristin [name]  Procedure(s) Performed: ULTRASOUND, UPPER GI TRACT, ENDOSCOPIC EGD (ESOPHAGOGASTRODUODENOSCOPY) BIOPSY, SKIN, SUBCUTANEOUS TISSUE, OR MUCOUS MEMBRANE FINE NEEDLE ASPIRATION  Patient Location: Endoscopy Unit  Anesthesia Type:MAC  Level of Consciousness: drowsy and patient cooperative  Airway & Oxygen  Therapy: Patient Spontanous Breathing and Patient connected to face mask oxygen   Post-op Assessment: Report given to RN and Post -op Vital signs reviewed and stable  Post vital signs: Reviewed and stable  Last Vitals:  Vitals Value Taken Time  BP 118/59 01/27/24   11:58  Temp 36.4 01/27/24   11:58  Pulse 64 01/27/24 11:57  Resp 14 01/27/24 11:57  SpO2 100 % 01/27/24 11:57  Vitals shown include unfiled device data.  Last Pain:  Vitals:   01/27/24 0957  TempSrc: Temporal  PainSc: 0-No pain         Complications: No notable events documented.

## 2024-01-27 NOTE — Anesthesia Preprocedure Evaluation (Signed)
"                                    Anesthesia Evaluation  Patient identified by MRN, date of birth, ID band Patient awake    Reviewed: Allergy & Precautions, NPO status , Patient's Chart, lab work & pertinent test results, reviewed documented beta blocker date and time   History of Anesthesia Complications (+) PONV and history of anesthetic complications  Airway Mallampati: III  TM Distance: >3 FB     Dental no notable dental hx.    Pulmonary sleep apnea , former smoker   breath sounds clear to auscultation       Cardiovascular (-) angina + CAD  (-) Past MI, (-) Cardiac Stents and (-) CABG  Rhythm:Regular Rate:Normal     Neuro/Psych neg Seizures    GI/Hepatic   Endo/Other  Hypothyroidism    Renal/GU Renal disease     Musculoskeletal   Abdominal   Peds  Hematology   Anesthesia Other Findings   Reproductive/Obstetrics                              Anesthesia Physical Anesthesia Plan  ASA: 2  Anesthesia Plan: MAC   Post-op Pain Management:    Induction: Intravenous  PONV Risk Score and Plan: 3 and Propofol  infusion and Ondansetron   Airway Management Planned: Natural Airway and Nasal Cannula  Additional Equipment:   Intra-op Plan:   Post-operative Plan:   Informed Consent: I have reviewed the patients History and Physical, chart, labs and discussed the procedure including the risks, benefits and alternatives for the proposed anesthesia with the patient or authorized representative who has indicated his/her understanding and acceptance.     Dental advisory given  Plan Discussed with: CRNA  Anesthesia Plan Comments:          Anesthesia Quick Evaluation  "

## 2024-01-27 NOTE — Discharge Instructions (Signed)

## 2024-01-27 NOTE — Anesthesia Procedure Notes (Signed)
 Date/Time: 01/27/2024 11:03 AM  Performed by: Para Jerelene CROME, CRNAOxygen Delivery Method: Simple face mask Comments: POM Face Mask.

## 2024-01-27 NOTE — Op Note (Signed)
 [facility] Patient Name: Kristin [name] Procedure Date: 01/27/2024 MRN: [id number] Attending MD: Aloha Finner , MD, [id number] Date of Birth: [birth year] CSN: [id number] Age: 73 Admit Type: Ambulatory Procedure:                Upper EUS Indications:              Suspected mass in pancreas on MRCP Providers:                Aloha Finner, MD, Randall Lines, RN, [place]y [place], Technician Referring MD:              Medicines:                Monitored Anesthesia Care Complications:            No immediate complications. Estimated Blood Loss:     Estimated blood loss was minimal. Procedure:                Pre-Anesthesia Assessment:                           - Prior to the procedure, a History and Physical                            was performed, and patient medications and                            allergies were reviewed. The patient's tolerance of                            previous anesthesia was also reviewed. The risks                            and benefits of the procedure and the sedation                            options and risks were discussed with the patient.                            All questions were answered, and informed consent                            was obtained. Prior Anticoagulants: The patient has                            taken no anticoagulant or antiplatelet agents. ASA                            Grade Assessment: III - A patient with severe                            systemic disease. After reviewing the risks and  benefits, the patient was deemed in satisfactory                            condition to undergo the procedure.                           After obtaining informed consent, the endoscope was                            passed under direct vision. Throughout the                            procedure, the patient's blood pressure, pulse, and                             oxygen  saturations were monitored continuously. The                            [id number]-[id number] ([id number]) Olympus endoscope was introduced                            through the mouth, and advanced to the second part                            of duodenum. The [id number] ([id number]) Olympus                            endosonoscope was introduced through the mouth, and                            advanced to the duodenum for ultrasound examination                            from the esophagus, stomach and duodenum. The upper                            EUS was accomplished without difficulty. The                            patient tolerated the procedure. Scope In: Scope Out: Findings:      ENDOSCOPIC FINDING: :      No gross lesions were noted in the entire esophagus.      The Z-line was irregular and was found 35 cm from the incisors.      A 1 cm hiatal hernia was present.      Multiple dispersed small erosions with no bleeding and no stigmata of       recent bleeding were found in the entire examined stomach. Biopsies were       taken with a cold forceps for histology and Helicobacter pylori testing.      No gross lesions were noted in the duodenal bulb, in the first portion       of the duodenum and in the second portion of the duodenum.      The major papilla was normal.      ENDOSONOGRAPHIC FINDING: :  A rounded mass was identified in the pancreatic head. The mass was       heterogenous and mixed solid and multicystic. The mass measured 40 mm by       27 mm in maximal cross-sectional diameter. The largest cystic component       was 6 mm by 4 mm. The endosonographic borders were well-defined. An       intact interface was seen between the mass and the superior mesenteric       artery and celiac trunk suggesting a lack of invasion. The remainder of       the pancreas was examined. The endosonographic appearance of parenchyma       and the upstream pancreatic duct indicated no duct dilation,  no ductal       or parenchymal calcifications and no parenchymal atrophy. Fine needle       biopsy was performed due to elevated CA19-9. Color Doppler imaging was       utilized prior to needle puncture to confirm a lack of significant       vascular structures within the needle path. Five passes were made with       the 22 gauge Acquire biopsy needle using a transduodenal approach. A       visible core of tissue was obtained. Preliminary cytologic examination       and touch preps were performed. Final cytology results are pending.      There was no sign of significant endosonographic abnormality in the       common bile duct and in the common hepatic duct. An unremarkable       gallbladder and ducts of normal caliber were identified.      Endosonographic imaging of the ampulla showed no intramural       (subepithelial) lesion.      Endosonographic imaging in the visualized portion of the liver showed no       mass.      No malignant-appearing lymph nodes were visualized in the celiac region       (level 20), peripancreatic region and porta hepatis region.      Endosonographic imaging in the gastroesophageal junction showed no       intramural (subepithelial) lesion.      The celiac region was visualized.      The esophagus, stomach and duodenum were examined endosonographically. Impression:               EGD Impression:                           - No gross lesions in the entire esophagus. Z-line                            irregular, 35 cm from the incisors.                           - 1 cm hiatal hernia.                           - Erosive gastropathy with no bleeding and no                            stigmata of recent bleeding. Biopsied.                           -  No gross lesions in the duodenal bulb, in the                            first portion of the duodenum and in the second                            portion of the duodenum.                           - Normal major  papilla.                           EUS Impression:                           - A solid-mutlicystic mass was identified in the                            pancreatic head. Cytology results are pending.                            However, the endosonographic appearance is                            suggestive of a serous cystadenoma. Fine needle                            biopsy performed due to elevated CA19-9.                           - There was no sign of significant pathology in the                            common bile duct and in the common hepatic duct.                           - No malignant-appearing lymph nodes were                            visualized in the celiac region (level 20),                            peripancreatic region and porta hepatis region. Moderate Sedation:      Not Applicable - Patient had care per Anesthesia. Recommendation:           - The patient will be observed post-procedure,                            until all discharge criteria are met.                           - Discharge patient to home.                           - Patient has a contact  number available for                            emergencies. The signs and symptoms of potential                            delayed complications were discussed with the                            patient. Return to normal activities tomorrow.                            Written discharge instructions were provided to the                            patient.                           - Low fat diet.                           - Ciprofloxacin  500 mg twice daily x 3-days.                           - Start PPI 40 mg daily.                           - Observe patient's clinical course.                           - Await cytology results and await path results.                           - Followup to be dictated based on results of                            pathology/cytology.                           - The findings  and recommendations were discussed                            with the patient.                           - The findings and recommendations were discussed                            with the designated responsible adult. Procedure Code(s):        --- Professional ---                           [phone], Esophagogastroduodenoscopy, flexible,                            transoral; with transendoscopic ultrasound-guided  intramural or transmural fine needle                            aspiration/biopsy(s) (includes endoscopic                            ultrasound examination of the esophagus, stomach,                            and either the duodenum or a surgically altered                            stomach where the jejunum is examined distal to the                            anastomosis)                           43239, 59, Esophagogastroduodenoscopy, flexible,                            transoral; with biopsy, single or multiple Diagnosis Code(s):        --- Professional ---                           K22.89, Other specified disease of esophagus                           K44.9, Diaphragmatic hernia without obstruction or                            gangrene                           K31.89, Other diseases of stomach and duodenum                           K86.89, Other specified diseases of pancreas                           I89.9, Noninfective disorder of lymphatic vessels                            and lymph nodes, unspecified                           R93.3, Abnormal findings on diagnostic imaging of                            other parts of digestive tract CPT copyright 2022 American [employer] Association. All rights reserved. The codes documented in this report are preliminary and upon coder review may  be revised to meet current compliance requirements. Aloha Finner, MD 01/27/2024 12:15:34 PM Number of Addenda: 0

## 2024-01-27 NOTE — H&P (Signed)
 "  GASTROENTEROLOGY PROCEDURE H&P NOTE   Primary Care Physician: Sheryle Carwin, MD  HPI: Kristin [name] is a 73 y.o. female who presents for EGD/EUS to evaluate enlarging pancreatic cyst.  Past Medical History:  Diagnosis Date   Aortic atherosclerosis 09/30/2016   Complication of anesthesia    long time to wake up 2018 with same surgery   Coronary atherosclerosis 09/30/2016   By CT findings   Dizziness    Hypothyroidism    Lumbar degenerative disc disease 09/30/2016   Maxillary sinusitis    Osteoporosis    Pancreatic cyst    PONV (postoperative nausea and vomiting)    Renal disorder    Sleep apnea    mild osa, does not use CPAP   Thyroid  disease    Past Surgical History:  Procedure Laterality Date   BACK SURGERY  1997/1999   CARPAL TUNNEL RELEASE Right    CYSTECTOMY  1972   brreast   ESOPHAGOGASTRODUODENOSCOPY N/A 05/30/2023   Procedure: EGD (ESOPHAGOGASTRODUODENOSCOPY);  Surgeon: Wilhelmenia Aloha Raddle., MD;  Location: [facility];  Service: Gastroenterology;  Laterality: N/A;   ESOPHAGOGASTRODUODENOSCOPY (EGD) WITH PROPOFOL  N/A 02/16/2022   Procedure: ESOPHAGOGASTRODUODENOSCOPY (EGD) WITH PROPOFOL ;  Surgeon: Rollin Dover, MD;  Location: [facility];  Service: Gastroenterology;  Laterality: N/A;   EUS N/A 05/30/2023   Procedure: ULTRASOUND, UPPER GI TRACT, ENDOSCOPIC;  Surgeon: Wilhelmenia Aloha Raddle., MD;  Location: [facility];  Service: Gastroenterology;  Laterality: N/A;   FINE NEEDLE ASPIRATION N/A 02/16/2022   Procedure: FINE NEEDLE ASPIRATION (FNA) LINEAR;  Surgeon: Rollin Dover, MD;  Location: [facility];  Service: Gastroenterology;  Laterality: N/A;   FINE NEEDLE ASPIRATION  05/30/2023   Procedure: FINE NEEDLE ASPIRATION;  Surgeon: Wilhelmenia Aloha Raddle., MD;  Location: [facility];  Service: Gastroenterology;;   MAXILLARY ANTROSTOMY Left 08/09/2014   Procedure: ENDOSCOPIC MAXILLARY ANTROSTOMY;  Surgeon: Daniel Moccasin, MD;  Location: [facility];  Service:  ENT;  Laterality: Left;   MAXILLARY ANTROSTOMY Left 10/29/2017   Procedure: LEFT MAXILLARY ANTROSTOMY WITH TISSUE REMOVAL;  Surgeon: Moccasin Daniel, MD;  Location: [facility];  Service: ENT;  Laterality: Left;   NASAL SINUS SURGERY Left 08/09/2014   Procedure: ENDOSCOPIC SINUS SURGERY;  Surgeon: Daniel Moccasin, MD;  Location:  SURGERY CENTER;  Service: ENT;  Laterality: Left;   TUBAL LIGATION  1980   UPPER ESOPHAGEAL ENDOSCOPIC ULTRASOUND (EUS) N/A 02/16/2022   Procedure: UPPER ESOPHAGEAL ENDOSCOPIC ULTRASOUND (EUS);  Surgeon: Rollin Dover, MD;  Location: [facility];  Service: Gastroenterology;  Laterality: N/A;   Current Facility-Administered Medications  Medication Dose Route Frequency Provider Last Rate Last Admin   0.9 %  sodium chloride  infusion   Intravenous Continuous Mansouraty, Aloha Raddle., MD       Current Medications[1] Allergies[2] Family History  Problem Relation Age of Onset   Colon cancer Father    Stroke Paternal Grandfather    Cirrhosis Maternal Grandmother        liver   Hypertension Mother    Social History   Socioeconomic History   Marital status: Married    Spouse name: Not on file   Number of children: 1   Years of education: Not on file   Highest education level: Not on file  Occupational History    Employer: [employer]  Tobacco Use   Smoking status: Former    Current packs/day: 0.00    Types: Cigarettes    Start date: 01/30/1968    Quit date: 01/30/1971    Years since quitting:  53.0    Passive exposure: Past   Smokeless tobacco: Never  Vaping Use   Vaping status: Never Used  Substance and Sexual Activity   Alcohol use: No   Drug use: No   Sexual activity: Yes    Birth control/protection: Surgical    Comment: BTL  Other Topics Concern   Not on file  Social History Narrative   Not on file   Social Drivers of Health   Tobacco Use: Medium Risk (01/27/2024)   Patient History    Smoking Tobacco Use: Former    Smokeless  Tobacco Use: Never    Passive Exposure: Past  Physicist, Medical Strain: Not on file  Food Insecurity: Low Risk (12/06/2022)   Received from [facility]   Epic    Within the past 12 months, you worried that your food would run out before you got money to buy more: Never true    Within the past 12 months, the food you bought just didn't last and you didn't have money to get more. : Never true  Transportation Needs: No Transportation Needs (12/06/2022)   Received from [facility]    In the past 12 months, has lack of reliable transportation kept you from medical appointments, meetings, work or from getting things needed for daily living? : No  Physical Activity: Not on file  Stress: Not on file  Social Connections: Not on file  Intimate Partner Violence: Not on file  Depression (EYV7-0): Not on file  Alcohol Screen: Not on file  Housing: Low Risk (12/06/2022)   Received from [facility]   Epic    What is your living situation today?: I have a steady place to live    Think about the place you live. Do you have problems with any of the following? Choose all that apply:: Not on file  Utilities: Low Risk (12/06/2022)   Received from [facility]   Utilities    In the past 12 months has the electric, gas, oil, or water company threatened to shut off services in your home? : No  Health Literacy: Not on file    Physical Exam: Today's Vitals   01/24/24 1039  Weight: 54 kg   Body mass index is 22.49 kg/m. GEN: NAD EYE: Sclerae anicteric ENT: MMM CV: Non-tachycardic GI: Soft, NT/ND NEURO:  Alert & Oriented x 3  Lab Results: No results for input(s): WBC, HGB, HCT, PLT in the last 72 hours. BMET No results for input(s): NA, K, CL, CO2, GLUCOSE, BUN, CREATININE, CALCIUM  in the last 72 hours. LFT No results for input(s): PROT, ALBUMIN, AST, ALT, ALKPHOS, BILITOT, BILIDIR, IBILI in the last 72 hours. PT/INR No results for  input(s): LABPROT, INR in the last 72 hours.   Impression / Plan: This is a 73 y.o.female who presents for EGD/EUS to evaluate enlarging pancreatic cyst.  The risks of an EUS including intestinal perforation, bleeding, infection, aspiration, and medication effects were discussed as was the possibility it may not give a definitive diagnosis if a biopsy is performed.  When a biopsy of the pancreas is done as part of the EUS, there is an additional risk of pancreatitis at the rate of about 1-2%.  It was explained that procedure related pancreatitis is typically mild, although it can be severe and even life threatening, which is why we do not perform random pancreatic biopsies and only biopsy a lesion/area we feel is concerning enough to warrant the risk.   The risks and benefits  of endoscopic evaluation/treatment were discussed with the patient and/or family; these include but are not limited to the risk of perforation, infection, bleeding, missed lesions, lack of diagnosis, severe illness requiring hospitalization, as well as anesthesia and sedation related illnesses.  The patient's history has been reviewed, patient examined, no change in status, and deemed stable for procedure.  The patient and/or family was provided an opportunity to ask questions and all were answered.  The patient and/or family is agreeable to proceed.    Aloha Finner, MD [facility] Office # [phone]     [1]  Current Facility-Administered Medications:    0.9 %  sodium chloride  infusion, , Intravenous, Continuous, Mansouraty, Aloha Raddle., MD [2]  Allergies Allergen Reactions   Bee Venom Anaphylaxis   Hydrocodone -Acetaminophen  Other (See Comments)    Diarrhea, vomiting told to discontinue meds   Penicillins Swelling    Has patient had a PCN reaction causing immediate rash, facial/tongue/throat swelling, SOB or lightheadedness with hypotension: No Has patient had a PCN  reaction causing severe rash involving mucus membranes or skin necrosis: No Has patient had a PCN reaction that required hospitalization: No Has patient had a PCN reaction occurring within the last 10 years: No If all of the above answers are NO, then may proceed with Cephalosporin use.     Sulfa Antibiotics    "

## 2024-01-28 ENCOUNTER — Encounter (HOSPITAL_COMMUNITY): Payer: Self-pay | Admitting: Gastroenterology

## 2024-01-28 NOTE — Anesthesia Postprocedure Evaluation (Signed)
"   Anesthesia Post Note  Patient: Kristin [name]  Procedure(s) Performed: ULTRASOUND, UPPER GI TRACT, ENDOSCOPIC EGD (ESOPHAGOGASTRODUODENOSCOPY) BIOPSY, SKIN, SUBCUTANEOUS TISSUE, OR MUCOUS MEMBRANE FINE NEEDLE ASPIRATION     Patient location during evaluation: PACU Anesthesia Type: MAC Level of consciousness: awake and alert Pain management: pain level controlled Vital Signs Assessment: post-procedure vital signs reviewed and stable Respiratory status: spontaneous breathing, nonlabored ventilation, respiratory function stable and patient connected to nasal cannula oxygen  Cardiovascular status: blood pressure returned to baseline and stable Postop Assessment: no apparent nausea or vomiting Anesthetic complications: no   No notable events documented.  Last Vitals:  Vitals:   01/27/24 1210 01/27/24 1220  BP: 132/69 136/66  Pulse: (!) 59 (!) 58  Resp: 14 15  Temp:    SpO2: 98% 97%    Last Pain:  Vitals:   01/27/24 1220  TempSrc:   PainSc: 0-No pain                 Lynwood MARLA Cornea      "

## 2024-01-30 LAB — SURGICAL PATHOLOGY

## 2024-01-31 ENCOUNTER — Ambulatory Visit: Payer: Self-pay | Admitting: Gastroenterology

## 2024-02-04 LAB — CYTOLOGY - NON PAP

## 2024-02-06 ENCOUNTER — Telehealth: Payer: Self-pay | Admitting: Gastroenterology

## 2024-02-06 NOTE — Telephone Encounter (Signed)
 Inbound call from patient requesting a call back from the nurse to discuss her results from  procedure on 12/29. I advised that she was sent a letter, but she is still requesting a call. Please advise.

## 2024-02-06 NOTE — Telephone Encounter (Signed)
 I have called and discussed the results letter with the pt.  She will await a call for the remaining test to return. She will call back to make follow up appt

## 2024-02-07 NOTE — Telephone Encounter (Signed)
 Patient called stating that Dr. Wilhelmenia advised her that she could call if she had any questions and is requesting to speak with nurse. Please advise.

## 2024-02-07 NOTE — Telephone Encounter (Signed)
 See results note for further communication.

## 2024-02-11 ENCOUNTER — Telehealth: Payer: Self-pay

## 2024-02-11 NOTE — Telephone Encounter (Signed)
 Kristin [name] [name], RN; [facility] Referrals Pt has an appt with Dr Dasie on 02/13/24 at 2 arrive at 1:30. Pt aware  [name]

## 2024-02-19 ENCOUNTER — Other Ambulatory Visit: Payer: Self-pay | Admitting: *Deleted

## 2024-02-19 ENCOUNTER — Encounter: Payer: Self-pay | Admitting: Gastroenterology

## 2024-02-19 NOTE — Progress Notes (Signed)
 The proposed treatment discussed in conference is for discussion purpose only and is not a binding recommendation.  The patients have not been physically examined, or presented with their treatment options.  Therefore, final treatment plans cannot be decided.

## 2024-02-20 NOTE — Progress Notes (Signed)
 Case discussed at [facility] on 1/21.  Reviewed case with radiology and pathology surgical oncology (Dr. Dasie unfortunately not present for today's conference).  Although overall cannot state that the lesion is cyst issues for malignancy as her sampling has not shown that completely, pathology does still have concerns about this lesion and recommends close monitoring and potential repeat sampling if there continues to be concerns.  Patient chart reviewed and it seems that patient discussed with Dr. Dasie repeat MRI MRCP with CA 19-9 in 57-month interval and surgical plans to be on hold unless something else develops or progresses.  I spoke with the patient on 1/22 to update her about these results and this discussion.  I will discuss with Dr. Dasie our plan of repeat imaging and whether we may want to consider doing that at an interval of 4 to 6 months from prior EUS with repeat laboratories to be prepared for potential repeat EUS.  The patient is in agreement with this plan of action.   Aloha Finner, MD [facility] Office # [phone]

## 2024-02-29 NOTE — Progress Notes (Signed)
 Although the team also did agree that the appearance throughout was more serous cystadenoma, because of the elevated CA 19-9, they felt potentially an earlier follow-up would be reasonable as well. I think 4 to 6 months is perfectly fine just wanted to give you their final update. We can just go ahead and split the difference and make it 5 months. Sound good? Thanks. GM
